# Patient Record
Sex: Male | Born: 1960 | Race: White | Hispanic: No | Marital: Single | Smoking: Never smoker
Health system: Southern US, Community
[De-identification: ages and names within clinical notes are randomized; demographics above are authoritative.]

## PROBLEM LIST (undated history)

## (undated) DIAGNOSIS — I10 Essential (primary) hypertension: Secondary | ICD-10-CM

## (undated) DIAGNOSIS — I509 Heart failure, unspecified: Secondary | ICD-10-CM

## (undated) DIAGNOSIS — E785 Hyperlipidemia, unspecified: Secondary | ICD-10-CM

## (undated) DIAGNOSIS — E663 Overweight: Secondary | ICD-10-CM

## (undated) DIAGNOSIS — I2109 ST elevation (STEMI) myocardial infarction involving other coronary artery of anterior wall: Secondary | ICD-10-CM

## (undated) DIAGNOSIS — Z9861 Coronary angioplasty status: Secondary | ICD-10-CM

## (undated) DIAGNOSIS — E119 Type 2 diabetes mellitus without complications: Secondary | ICD-10-CM

## (undated) DIAGNOSIS — I2510 Atherosclerotic heart disease of native coronary artery without angina pectoris: Secondary | ICD-10-CM

## (undated) HISTORY — DX: Heart failure, unspecified: I50.9

## (undated) HISTORY — DX: Essential (primary) hypertension: I10

## (undated) HISTORY — DX: Hyperlipidemia, unspecified: E78.5

---

## 2009-11-12 ENCOUNTER — Ambulatory Visit: Payer: Self-pay | Admitting: Cardiology

## 2009-11-13 ENCOUNTER — Inpatient Hospital Stay (HOSPITAL_COMMUNITY): Admission: AC | Admit: 2009-11-13 | Discharge: 2009-11-17 | Payer: Self-pay

## 2009-11-13 DIAGNOSIS — I2109 ST elevation (STEMI) myocardial infarction involving other coronary artery of anterior wall: Secondary | ICD-10-CM

## 2009-11-13 HISTORY — DX: ST elevation (STEMI) myocardial infarction involving other coronary artery of anterior wall: I21.09

## 2009-11-16 ENCOUNTER — Encounter: Payer: Self-pay | Admitting: Cardiology

## 2009-12-14 DIAGNOSIS — I4891 Unspecified atrial fibrillation: Secondary | ICD-10-CM

## 2009-12-14 DIAGNOSIS — I5042 Chronic combined systolic (congestive) and diastolic (congestive) heart failure: Secondary | ICD-10-CM | POA: Insufficient documentation

## 2009-12-14 DIAGNOSIS — E785 Hyperlipidemia, unspecified: Secondary | ICD-10-CM | POA: Insufficient documentation

## 2009-12-14 DIAGNOSIS — I509 Heart failure, unspecified: Secondary | ICD-10-CM

## 2009-12-14 DIAGNOSIS — Z9861 Coronary angioplasty status: Secondary | ICD-10-CM

## 2009-12-14 DIAGNOSIS — N186 End stage renal disease: Secondary | ICD-10-CM

## 2009-12-14 DIAGNOSIS — I2510 Atherosclerotic heart disease of native coronary artery without angina pectoris: Secondary | ICD-10-CM

## 2009-12-14 HISTORY — DX: Heart failure, unspecified: I50.9

## 2009-12-16 ENCOUNTER — Ambulatory Visit: Payer: Self-pay | Admitting: Cardiology

## 2010-02-21 ENCOUNTER — Ambulatory Visit: Payer: Self-pay | Admitting: Cardiology

## 2010-02-28 ENCOUNTER — Encounter: Payer: Self-pay | Admitting: Cardiology

## 2010-02-28 LAB — CONVERTED CEMR LAB
ALT: 18 units/L (ref 0–53)
AST: 22 units/L (ref 0–37)
Albumin: 4.1 g/dL (ref 3.5–5.2)
Alkaline Phosphatase: 49 units/L (ref 39–117)
Bilirubin, Direct: 0.2 mg/dL (ref 0.0–0.3)
Cholesterol: 117 mg/dL (ref 0–200)
Total Protein: 7 g/dL (ref 6.0–8.3)
Triglycerides: 113 mg/dL (ref 0.0–149.0)

## 2010-04-04 ENCOUNTER — Telehealth: Payer: Self-pay | Admitting: Cardiology

## 2010-04-05 ENCOUNTER — Ambulatory Visit
Admission: RE | Admit: 2010-04-05 | Discharge: 2010-04-05 | Payer: Self-pay | Source: Home / Self Care | Attending: Cardiology | Admitting: Cardiology

## 2010-04-12 NOTE — Assessment & Plan Note (Signed)
Summary: eph/jml   Visit Type:  Follow-up Primary Provider:  [facility]  CC:  CAD and Cardiomyopathy.  History of Present Illness: The patient presents for followup of non-Q-wave infarction with severe three-vessel coronary disease as described below. He is status post percutaneous revascularization. He also has a cardiomyopathy. Since that time the patient has had no new symptoms or symptoms consistent with his presentation. He has refused cardiac rehabilitation. He denies any chest discomfort, neck or arm discomfort. He has had no palpitations, presyncope or syncope. He has had no PND or orthopnea. He has been trying to dissipate in some activity and is walking. He has been trying to eat better. He says his sugars have been so low that his insulin was stopped by his primary provider.  Current Medications (verified): 1)  Coreg 6.25 Mg Tabs (Carvedilol) .Marland Kitchen.. 1 By Mouth Two Times A Day 2)  Aspirin Ec 325 Mg Tbec (Aspirin) .... Take One Tablet By Mouth Daily 3)  Acetaminophen 325 Mg  Tabs (Acetaminophen) .... As Needed 4)  Enalapril Maleate 2.5 Mg Tabs (Enalapril Maleate) .Marland Kitchen.. 1 By Mouth Two Times A Day 5)  Furosemide 40 Mg Tabs (Furosemide) .Marland Kitchen.. 1 By Mouth Daily 6)  Glimepiride 4 Mg Tabs (Glimepiride) .Marland Kitchen.. 1 By Mouth Daily 7)  Isosorbide Mononitrate Cr 60 Mg Xr24h-Tab (Isosorbide Mononitrate) .Marland Kitchen.. 1 By Mouth Daily 8)  Metformin Hcl 500 Mg Tabs (Metformin Hcl) .Marland Kitchen.. 1 By Mouth Two Times A Day 9)  Nitrostat 0.4 Mg Subl (Nitroglycerin) .Marland Kitchen.. 1 By Mouth As Needed 10)  Effient 10 Mg Tabs (Prasugrel Hcl) .Marland Kitchen.. 1 By Mouth Daily 11)  Simvastatin 40 Mg Tabs (Simvastatin) .Marland Kitchen.. 1 By Mouth Daily 12)  Aldactone 25 Mg Tabs (Spironolactone) .... 1/2  By Mouth Daily  Allergies (verified): No Known Drug Allergies  Past History:  Past Medical History:  1. Non-ST-elevation myocardial infarction.       a.     Cardiac catheterization, November 14, 2009:  Large anterior        wall  myocardial infarction with diffuse triple-vessel coronary        artery disease and severe left ventricular systolic dysfunction.        Left anterior descending with 80% ostial and 70% proximal, total        proximal occlusion of left anterior descending after that, total        occlusion of the ramus branch of the circumflex artery with 80%        stenosis in the distal posterolateral branch, multiple 90%        stenosis lesions in posterior descending branch, multiple 80%        stenosis lesions in posterolateral branches of right coronary        artery with apical wall akinesis and estimated left ventricular        ejection fraction at 30-35%.       b.     At that time of procedure above, successful reperfusion of        percutaneous coronary intervention of the totally occluded        proximal left anterior descending with improvement in proximal and        mid stenosis from 90% to 0% with two overlapping drug-eluting        stents (ION 2.5 x 28 mm and 2.7 ION).       c.     At the time of procedures above, successful percutaneous  coronary intervention of the ostial and very proximal left        anterior descending using one drug-eluting stent (ION 3.0 x 60 mm)        with improvement in center narrowing from 80 and 70% to 20%.   2. Acute systolic heart failure secondary to ischemic cardiomyopathy.   3. Diabetes  4. Hyperlipidemia  Review of Systems       As stated in the HPI and negative for all other systems.   Vital Signs:  Patient profile:   50 year old male Height:      67 inches Weight:      251 pounds BMI:     39.45 Pulse rate:   65 / minute Resp:     16 per minute BP sitting:   138 / 82  (right arm)  Vitals Entered By: Marrion Coy, CNA (December 16, 2009 12:10 PM)  Physical Exam  General:  Well developed, well nourished, in no acute distress. Head:  normocephalic and atraumatic Eyes:  PERRLA/EOM intact; conjunctiva and lids normal. Mouth:  Teeth, gums  and palate normal. Oral mucosa normal. Neck:  Neck supple, no JVD. No masses, thyromegaly or abnormal cervical nodes. Chest Wall:  no deformities or breast masses noted Lungs:  Clear bilaterally to auscultation and percussion. Abdomen:  Bowel sounds positive; abdomen soft and non-tender without masses, organomegaly, or hernias noted. No hepatosplenomegaly. Msk:  Back normal, normal gait. Muscle strength and tone normal. Extremities:  No clubbing or cyanosis. Neurologic:  Alert and oriented x 3. Skin:  Intact without lesions or rashes. Cervical Nodes:  no significant adenopathy Axillary Nodes:  no significant adenopathy Psych:  Normal affect.   Detailed Cardiovascular Exam  Neck    Carotids: Carotids full and equal bilaterally without bruits.      Neck Veins: Normal, no JVD.    Heart    Inspection: no deformities or lifts noted.      Palpation: normal PMI with no thrills palpable.      Auscultation: regular rate and rhythm, S1, S2 without murmurs, rubs, gallops, or clicks.    Vascular    Abdominal Aorta: no palpable masses, pulsations, or audible bruits.      Femoral Pulses: normal femoral pulses bilaterally.      Pedal Pulses: normal pedal pulses bilaterally.      Radial Pulses: normal radial pulses bilaterally.      Peripheral Circulation: no clubbing, cyanosis, or edema noted with normal capillary refill.     EKG  Procedure date:  12/16/2009  Findings:      Sinus rhythm, rate 65, leftward axis, recent anteroseptal infarct with anterolateral T wave inversions  Impression & Recommendations:  Problem # 1:  CAD (ICD-414.00) The patient has severe CAD status post multivessel revascularization. I discussed the importance of risk reduction in great detail. Hopefully he can comply. He still declines cardiac rehabilitation.  Problem # 2:  CHF (ICD-428.0) Today I will increase his carvedilol to 12.5 mg b.i.d.  Problem # 3:  HYPERLIPIDEMIA (ICD-272.4) When he returns for med  titration I will check a lipid profile with a goal LDL less than 70 and HDL greater than 40.  Other Orders: EKG w/ Interpretation (93000)  Patient Instructions: 1)  Your physician recommends that you schedule a follow-up appointment in: 2 months with Dr Antoine Poche 2)  Your physician recommends that you return for a FASTING lipid and liver profile: 272.0 v58.69 3)  Your physician has recommended you make the following change  in your medication: increase carvedilol to 12.5 mg one twice a day Prescriptions: CARVEDILOL 12.5 MG TABS (CARVEDILOL) one twice a day  #60 x 6   Entered by:   Charolotte Capuchin, RN   Authorized by:   Rollene Rotunda, MD, [facility]   Signed by:   Charolotte Capuchin, RN on 12/16/2009   Method used:   Electronically to        [facility]  [address] (retail)       [address]       [place], [place]  [address]       Ph: [phone]       Fax: [phone]   RxID:   [id number]  I have reviewed and approved all prescriptions at the time of this visit. Rollene Rotunda, MD, [facility]  December 16, 2009 1:27 PM

## 2010-04-14 NOTE — Progress Notes (Signed)
Summary: pt needs samples  Phone Note Refill Request Call back at Work Phone [phone] Message from:  Patient  Refills Requested: Medication #1:  EFFIENT 10 MG TABS 1 by mouth daily pt needs samples  Initial call taken by: Omer Jack,  April 04, 2010 4:38 PM  Follow-up for Phone Call        pt has appt with [facility] today. No samples available at this time. Will offer pt discount copay card. Marrion Coy, CNA  April 05, 2010 8:43 AM  Follow-up by: Marrion Coy, CNA,  April 05, 2010 8:43 AM

## 2010-04-14 NOTE — Letter (Signed)
Summary: Custom - Lipid  [facility], Main Office  [address]   [place], [place] [address]   Phone: [phone]  Fax: [phone]         February 28, 2010 MRN: [id number]   Justin [name] [address] [place], [place]  [address]   Dear Mr. Justin [name],  We have reviewed your cholesterol results.  They are as follows:     Total Cholesterol:    117 (Desirable: less than 200)       HDL  Cholesterol:     27.40  (Desirable: greater than 40 for men and 50 for women)       LDL Cholesterol:       67  (Desirable: less than 100 for low risk and less than 70 for moderate to high risk)       Triglycerides:       113.0  (Desirable: less than 150)  Our recommendations include:  Please increase walking as much as possible to help increase HDL.    Call our office at the number listed above if you have any questions.  Lowering your LDL cholesterol is important, but it is only one of a large number of "risk factors" that may indicate that you are at risk for heart disease, stroke or other complications of hardening of the arteries.  Other risk factors include:   A.  Cigarette Smoking* B.  High Blood Pressure* C.  Obesity* D.   Low HDL Cholesterol (see yours above)* E.   Diabetes Mellitus (higher risk if your is uncontrolled) F.  Family history of premature heart disease G.  Previous history of stroke or cardiovascular disease    *These are risk factors YOU HAVE CONTROL OVER.  For more information, visit .        There is now evidence that lowering the TOTAL CHOLESTEROL AND LDL CHOLESTEROL can reduce the risk of heart disease.  The American Heart Association recommends the following guidelines for the treatment of elevated cholesterol:  1.  If there is now current heart disease and less than two risk factors, TOTAL CHOLESTEROL should be less than 200 and LDL CHOLESTEROL should be less than 100. 2.  If there is current heart disease or two or more risk factors,  TOTAL CHOLESTEROL should be less than 200 and LDL CHOLESTEROL should be less than 70.  A diet low in cholesterol, saturated fat, and calories is the cornerstone of treatment for elevated cholesterol.  Cessation of smoking and exercise are also important in the management of elevated cholesterol and preventing vascular disease.  Studies have shown that 30 to 60 minutes of physical activity most days can help lower blood pressure, lower cholesterol, and keep your weight at a healthy level.  Drug therapy is used when cholesterol levels do not respond to therapeutic lifestyle changes (smoking cessation, diet, and exercise) and remains unacceptably high.  If medication is started, it is important to have you levels checked periodically to evaluate the need for further treatment options.    Thank you,     Sander Nephew, RN for Dr Rollene Rotunda [facility] Team

## 2010-04-14 NOTE — Assessment & Plan Note (Signed)
Summary: per check out/sf   Visit Type:  Follow-up Primary Provider:  Dr. Creta Levin [facility])  CC:  Cardiomyopathy.  History of Present Illness: The patient presents for followup of his cardiomyopathy. At the last appointment I increased enalapril. He tolerated this well without lightheadedness, presyncope or syncope. He has had no chest pressure, neck or arm discomfort. He has had no palpitations. He denies any shortness of breath, PND or orthopnea. He has had no weight gain or edema.   Current Medications (verified): 1)  Carvedilol 12.5 Mg Tabs (Carvedilol) .... One Twice A Day 2)  Aspirin Ec 325 Mg Tbec (Aspirin) .... Take One Tablet By Mouth Daily 3)  Acetaminophen 325 Mg  Tabs (Acetaminophen) .... As Needed 4)  Enalapril Maleate 5 Mg Tabs (Enalapril Maleate) .Marland Kitchen.. 1 Every Morning and 2 Every Evening 5)  Furosemide 40 Mg Tabs (Furosemide) .Marland Kitchen.. 1 By Mouth Daily 6)  Glimepiride 4 Mg Tabs (Glimepiride) .Marland Kitchen.. 1 By Mouth Daily 7)  Isosorbide Mononitrate Cr 60 Mg Xr24h-Tab (Isosorbide Mononitrate) .Marland Kitchen.. 1 By Mouth Daily 8)  Metformin Hcl 500 Mg Tabs (Metformin Hcl) .Marland Kitchen.. 1 By Mouth Two Times A Day 9)  Nitrostat 0.4 Mg Subl (Nitroglycerin) .Marland Kitchen.. 1 By Mouth As Needed 10)  Effient 10 Mg Tabs (Prasugrel Hcl) .Marland Kitchen.. 1 By Mouth Daily 11)  Simvastatin 40 Mg Tabs (Simvastatin) .Marland Kitchen.. 1 By Mouth Daily 12)  Aldactone 25 Mg Tabs (Spironolactone) .... 1/2  By Mouth Daily  Allergies (verified): No Known Drug Allergies  Past History:  Past Medical History: Reviewed history from 02/21/2010 and no changes required. 1. Non-ST-elevation myocardial infarction.     a.     Cardiac catheterization, November 14, 2009:  Large anterior      wall myocardial infarction with diffuse triple-vessel coronary      artery disease and severe left ventricular systolic dysfunction.      Left anterior descending with 80% ostial and 70% proximal, total      proximal occlusion of left anterior descending  after that, total      occlusion of the ramus branch of the circumflex artery with 80%      stenosis in the distal posterolateral branch, multiple 90%      stenosis lesions in posterior descending branch, multiple 80%      stenosis lesions in posterolateral branches of right coronary      artery with apical wall akinesis and estimated left ventricular      ejection fraction at 30-35%.     b.     At that time of procedure above, successful reperfusion of      percutaneous coronary intervention of the totally occluded      proximal left anterior descending with improvement in proximal and      mid stenosis from 90% to 0% with two overlapping drug-eluting      stents (ION 2.5 x 28 mm and 2.7 ION).     c.     At the time of procedures above, successful percutaneous      coronary intervention of the ostial and very proximal left      anterior descending using one drug-eluting stent (ION 3.0 x 60 mm)      with improvement in center narrowing from 80 and 70% to 20%.     d.     Discharged on dual antiplatelet therapy with full-strength      aspirin and Effient 10 mg p.o. daily (borderline response to  Plavix with PRU equal to 2-6), beta blockade with Coreg 6.25 mg      p.o. b.i.d., angiotensin-converting enzyme inhibitor therapy with      enalapril 2.5 mg p.o. b.i.d., Imdur 60 mg p.o. daily, and p.r.n.      sublingual nitroglycerin, as well as statin therapy with      simvastatin 40 mg p.o. daily. 2. Acute systolic heart failure secondary to ischemic cardiomyopathy.     a.     Please see cath results described in discharge diagnosis #1.     b.     A 2-D echocardiogram     Review of Systems       As stated in the HPI and negative for all other systems.   Vital Signs:  Patient profile:   50 year old male Height:      67 inches Weight:      238 pounds BMI:     37.41 Pulse rate:   72 / minute Resp:     16 per minute BP sitting:   134 / 82  (right arm)  Vitals Entered By: Marrion Coy, CNA (April 05, 2010 9:15 AM)  Physical Exam  General:  Well developed, well nourished, in no acute distress. Head:  normocephalic and atraumatic Eyes:  PERRLA/EOM intact; conjunctiva and lids normal. Mouth:  Teeth, gums and palate normal. Oral mucosa normal. Neck:  Neck supple, no JVD. No masses, thyromegaly or abnormal cervical nodes. Chest Wall:  no deformities or breast masses noted Lungs:  Clear bilaterally to auscultation and percussion. Abdomen:  Bowel sounds positive; abdomen soft and non-tender without masses, organomegaly, or hernias noted. No hepatosplenomegaly. Msk:  Back normal, normal gait. Muscle strength and tone normal. Extremities:  No clubbing or cyanosis. Neurologic:  Alert and oriented x 3. Skin:  Intact without lesions or rashes. Cervical Nodes:  no significant adenopathy Psych:  Normal affect.   Detailed Cardiovascular Exam  Neck    Carotids: Carotids full and equal bilaterally without bruits.      Neck Veins: Normal, no JVD.    Heart    Inspection: no deformities or lifts noted.      Palpation: normal PMI with no thrills palpable.      Auscultation: regular rate and rhythm, S1, S2 without murmurs, rubs, gallops, or clicks.    Vascular    Abdominal Aorta: no palpable masses, pulsations, or audible bruits.      Femoral Pulses: normal femoral pulses bilaterally.      Pedal Pulses: normal pedal pulses bilaterally.      Radial Pulses: normal radial pulses bilaterally.      Peripheral Circulation: no clubbing, cyanosis, or edema noted with normal capillary refill.     Impression & Recommendations:  Problem # 1:  CHF (ICD-428.0) Today I will increase his ienalapril by adding another 5 mg p.m. dose.  Problem # 2:  HYPERLIPIDEMIA (ICD-272.4) He remains on meds as listed. His LDL is at target to his age he is low. He has been walking more and hopefully this will bring the HDL up  Problem # 3:  CAD (ICD-414.00) He continues with risk  reduction.  Patient Instructions: 1)  Your physician recommends that you schedule a follow-up appointment in: 6 weeks  2)  Your physician has recommended you make the following change in your medication: Increase Enalapril 5mg  every morning and 10mg  every evening. Prescriptions: ENALAPRIL MALEATE 5 MG TABS (ENALAPRIL MALEATE) 1 every morning and 2 every evening  #90 x 11  Entered by:   Marrion Coy, CNA   Authorized by:   Rollene Rotunda, MD, [facility]   Signed by:   Marrion Coy, CNA on 04/05/2010   Method used:   Electronically to        [facility] (retail)       [address]       [place], [place]  [address]       Ph: [phone]       Fax: [phone]   RxID:   [id number]  I have reviewed and approved all prescriptions at the time of this visit.

## 2010-04-14 NOTE — Assessment & Plan Note (Signed)
Summary: f40m per check out/pt to have lab work today/lg   Visit Type:  Follow-up Primary Provider:  [facility]   History of Present Illness: The patient presents for followup of his known coronary disease and cardiomyopathy. Since I last saw him he has had no new cardiovascular complaints. I increased his rate to 12.5 mg b.i.d. He has done well with this. He has changed his diet and is walking or exercising daily. He has lost 8 pounds. He is having the chest pressure, neck or arm discomfort. He is having no shortness of breath, PND or orthopnea. He is having no palpitations, presyncope or syncope.   Current Medications (verified): 1)  Carvedilol 12.5 Mg Tabs (Carvedilol) .... One Twice A Day 2)  Aspirin Ec 325 Mg Tbec (Aspirin) .... Take One Tablet By Mouth Daily 3)  Acetaminophen 325 Mg  Tabs (Acetaminophen) .... As Needed 4)  Enalapril Maleate 2.5 Mg Tabs (Enalapril Maleate) .Marland Kitchen.. 1 By Mouth Two Times A Day 5)  Furosemide 40 Mg Tabs (Furosemide) .Marland Kitchen.. 1 By Mouth Daily 6)  Glimepiride 4 Mg Tabs (Glimepiride) .Marland Kitchen.. 1 By Mouth Daily 7)  Isosorbide Mononitrate Cr 60 Mg Xr24h-Tab (Isosorbide Mononitrate) .Marland Kitchen.. 1 By Mouth Daily 8)  Metformin Hcl 500 Mg Tabs (Metformin Hcl) .Marland Kitchen.. 1 By Mouth Two Times A Day 9)  Nitrostat 0.4 Mg Subl (Nitroglycerin) .Marland Kitchen.. 1 By Mouth As Needed 10)  Effient 10 Mg Tabs (Prasugrel Hcl) .Marland Kitchen.. 1 By Mouth Daily 11)  Simvastatin 40 Mg Tabs (Simvastatin) .Marland Kitchen.. 1 By Mouth Daily 12)  Aldactone 25 Mg Tabs (Spironolactone) .... 1/2  By Mouth Daily  Allergies (verified): No Known Drug Allergies  Past History:  Past Medical History: 1. Non-ST-elevation myocardial infarction.     a.     Cardiac catheterization, November 14, 2009:  Large anterior      wall myocardial infarction with diffuse triple-vessel coronary      artery disease and severe left ventricular systolic dysfunction.      Left anterior descending with 80% ostial and 70% proximal, total  proximal occlusion of left anterior descending after that, total      occlusion of the ramus branch of the circumflex artery with 80%      stenosis in the distal posterolateral branch, multiple 90%      stenosis lesions in posterior descending branch, multiple 80%      stenosis lesions in posterolateral branches of right coronary      artery with apical wall akinesis and estimated left ventricular      ejection fraction at 30-35%.     b.     At that time of procedure above, successful reperfusion of      percutaneous coronary intervention of the totally occluded      proximal left anterior descending with improvement in proximal and      mid stenosis from 90% to 0% with two overlapping drug-eluting      stents (ION 2.5 x 28 mm and 2.7 ION).     c.     At the time of procedures above, successful percutaneous      coronary intervention of the ostial and very proximal left      anterior descending using one drug-eluting stent (ION 3.0 x 60 mm)      with improvement in center narrowing from 80 and 70% to 20%.     d.     Discharged on dual antiplatelet therapy with full-strength      aspirin and  Effient 10 mg p.o. daily (borderline response to      Plavix with PRU equal to 2-6), beta blockade with Coreg 6.25 mg      p.o. b.i.d., angiotensin-converting enzyme inhibitor therapy with      enalapril 2.5 mg p.o. b.i.d., Imdur 60 mg p.o. daily, and p.r.n.      sublingual nitroglycerin, as well as statin therapy with      simvastatin 40 mg p.o. daily. 2. Acute systolic heart failure secondary to ischemic cardiomyopathy.     a.     Please see cath results described in discharge diagnosis #1.     b.     A 2-D echocardiogram     Past Surgical History: None.   Family History:  Is very can positive for early coronary artery disease   with his father having MI in his 84s.  Mother with cardiomyopathy.  A   sister with bypass in her 63s and another sister with heart disease   starting in her 30s.           Social History:   The patient lives with his girlfriend.  He has never   smoked cigarettes.  Does not drink alcohol.      Review of Systems       As stated in the HPI and negative for all other systems.   Vital Signs:  Patient profile:   50 year old male Height:      67 inches Weight:      243 pounds BMI:     38.20 Pulse rate:   64 / minute Resp:     16 per minute BP sitting:   148 / 89  (right arm)  Vitals Entered By: Marrion Coy, CNA (February 21, 2010 8:48 AM)  Physical Exam  General:  Well developed, well nourished, in no acute distress. Head:  normocephalic and atraumatic Eyes:  PERRLA/EOM intact; conjunctiva and lids normal. Mouth:  Teeth, gums and palate normal. Oral mucosa normal. Neck:  Neck supple, no JVD. No masses, thyromegaly or abnormal cervical nodes. Chest Wall:  no deformities or breast masses noted Lungs:  Clear bilaterally to auscultation and percussion. Abdomen:  Bowel sounds positive; abdomen soft and non-tender without masses, organomegaly, or hernias noted. No hepatosplenomegaly. Msk:  Back normal, normal gait. Muscle strength and tone normal. Extremities:  No clubbing or cyanosis. Neurologic:  Alert and oriented x 3. Skin:  Intact without lesions or rashes. Cervical Nodes:  no significant adenopathy Axillary Nodes:  no significant adenopathy Psych:  Normal affect.   Detailed Cardiovascular Exam  Neck    Carotids: Carotids full and equal bilaterally without bruits.      Neck Veins: Normal, no JVD.    Heart    Inspection: no deformities or lifts noted.      Palpation: normal PMI with no thrills palpable.      Auscultation: regular rate and rhythm, S1, S2 without murmurs, rubs, gallops, or clicks.    Vascular    Abdominal Aorta: no palpable masses, pulsations, or audible bruits.      Femoral Pulses: normal femoral pulses bilaterally.      Pedal Pulses: normal pedal pulses bilaterally.      Radial Pulses: normal radial pulses  bilaterally.      Peripheral Circulation: no clubbing, cyanosis, or edema noted with normal capillary refill.     Impression & Recommendations:  Problem # 1:  CAD (ICD-414.00) The patient has had no new cardiovascular complaints. He has done nicely and  risk reduction.  I will titrate meds as below.  Problem # 2:  CHF (ICD-428.0) I will increase his enalapril to 5 mg by mouth two times a day.    Problem # 3:  HYPERLIPIDEMIA (ICD-272.4) He will have a lipid profile today.  I will treat to an LDL less than 70 and HDL greater than 40.  Patient Instructions: 1)  Your physician recommends that you schedule a follow-up appointment in: 1 month 2)  Your physician has recommended you make the following change in your medication: INCREASE your ENALAPRIL to 5mg  by mouth two times a day.  Prescriptions: ENALAPRIL MALEATE 5 MG TABS (ENALAPRIL MALEATE) Take one tablet by mouth twice a day  #60 x 3   Entered by:   Whitney Maeola Sarah RN   Authorized by:   Rollene Rotunda, MD, [facility]   Signed by:   Ellender Hose RN on 02/21/2010   Method used:   Electronically to        [facility] (retail)       [address]       [place], [place]  [address]       Ph: [phone]       Fax: [phone]   RxID:   [id number]  I have reviewed and approved all prescriptions at the time of this visit. Rollene Rotunda, MD, [facility]  February 21, 2010 1:33 PM

## 2010-05-13 ENCOUNTER — Encounter (INDEPENDENT_AMBULATORY_CARE_PROVIDER_SITE_OTHER): Payer: Self-pay | Admitting: *Deleted

## 2010-05-16 ENCOUNTER — Ambulatory Visit: Payer: Self-pay | Admitting: Cardiology

## 2010-05-19 NOTE — Letter (Signed)
Summary: Appointment - Reschedule  [facility], Main Office  [address]   [place], [place] [address]   Phone: [phone]  Fax: [phone]     May 13, 2010 MRN: [id number]   [name] [address] [place], [place]  [address]   Dear Mr. BRAKEBILL,   Due to a change in our office schedule, your appointment on 06-13-2010                      at   10:15 a.m.              must be changed.  It is very important that we reach you to reschedule this appointment. We look forward to participating in your health care needs. Please contact us at the number listed above at your earliest convenience to reschedule this appointment.     Sincerely,       Lorne Skeens  [facility] Scheduling Team

## 2010-05-26 LAB — CBC
HCT: 40.6 % (ref 39.0–52.0)
HCT: 42.9 % (ref 39.0–52.0)
Hemoglobin: 13.7 g/dL (ref 13.0–17.0)
Hemoglobin: 14.7 g/dL (ref 13.0–17.0)
Hemoglobin: 14.8 g/dL (ref 13.0–17.0)
Hemoglobin: 16.2 g/dL (ref 13.0–17.0)
Hemoglobin: 16.8 g/dL (ref 13.0–17.0)
MCH: 30.8 pg (ref 26.0–34.0)
MCH: 31.3 pg (ref 26.0–34.0)
MCH: 31.6 pg (ref 26.0–34.0)
MCH: 31.7 pg (ref 26.0–34.0)
MCHC: 33.7 g/dL (ref 30.0–36.0)
MCHC: 34 g/dL (ref 30.0–36.0)
MCHC: 34.2 g/dL (ref 30.0–36.0)
MCHC: 35.1 g/dL (ref 30.0–36.0)
MCHC: 35.2 g/dL (ref 30.0–36.0)
MCV: 90.5 fL (ref 78.0–100.0)
MCV: 91.6 fL (ref 78.0–100.0)
Platelets: 259 10*3/uL (ref 150–400)
Platelets: 271 10*3/uL (ref 150–400)
Platelets: 297 10*3/uL (ref 150–400)
RBC: 4.74 MIL/uL (ref 4.22–5.81)
RBC: 4.74 MIL/uL (ref 4.22–5.81)
RDW: 12.3 % (ref 11.5–15.5)
RDW: 12.4 % (ref 11.5–15.5)
RDW: 12.6 % (ref 11.5–15.5)
RDW: 12.6 % (ref 11.5–15.5)
WBC: 10.7 10*3/uL — ABNORMAL HIGH (ref 4.0–10.5)

## 2010-05-26 LAB — BASIC METABOLIC PANEL
BUN: 16 mg/dL (ref 6–23)
BUN: 20 mg/dL (ref 6–23)
BUN: 20 mg/dL (ref 6–23)
BUN: 22 mg/dL (ref 6–23)
CO2: 24 mEq/L (ref 19–32)
CO2: 27 mEq/L (ref 19–32)
CO2: 28 mEq/L (ref 19–32)
CO2: 29 mEq/L (ref 19–32)
CO2: 30 mEq/L (ref 19–32)
Calcium: 9.2 mg/dL (ref 8.4–10.5)
Calcium: 9.4 mg/dL (ref 8.4–10.5)
Chloride: 100 meq/L (ref 96–112)
Chloride: 96 mEq/L (ref 96–112)
Chloride: 99 mEq/L (ref 96–112)
Chloride: 99 mEq/L (ref 96–112)
Creatinine, Ser: 0.91 mg/dL (ref 0.4–1.5)
Creatinine, Ser: 0.95 mg/dL (ref 0.4–1.5)
Creatinine, Ser: 1.05 mg/dL (ref 0.4–1.5)
GFR calc Af Amer: >60 mL/min (ref >60)
GFR calc Af Amer: >60 mL/min (ref >60)
GFR calc Af Amer: >60 mL/min (ref >60)
GFR calc Af Amer: >60 mL/min (ref >60)
GFR calc Af Amer: >60 mL/min (ref >60)
GFR calc non Af Amer: >60 mL/min (ref >60)
GFR calc non Af Amer: >60 mL/min (ref >60)
GFR calc non Af Amer: >60 mL/min (ref >60)
GFR calc non Af Amer: >60 mL/min (ref >60)
GFR calc non Af Amer: >60 mL/min (ref >60)
Glucose, Bld: 182 mg/dL — ABNORMAL HIGH (ref 70–99)
Glucose, Bld: 186 mg/dL — ABNORMAL HIGH (ref 70–99)
Glucose, Bld: 208 mg/dL — ABNORMAL HIGH (ref 70–99)
Glucose, Bld: 250 mg/dL — ABNORMAL HIGH (ref 70–99)
Glucose, Bld: 413 mg/dL — ABNORMAL HIGH (ref 70–99)
Potassium: 3.2 mEq/L — ABNORMAL LOW (ref 3.5–5.1)
Potassium: 3.4 mEq/L — ABNORMAL LOW (ref 3.5–5.1)
Potassium: 3.6 mEq/L (ref 3.5–5.1)
Potassium: 3.8 mEq/L (ref 3.5–5.1)
Potassium: 4.1 meq/L (ref 3.5–5.1)
Potassium: 4.2 mEq/L (ref 3.5–5.1)
Sodium: 133 mEq/L — ABNORMAL LOW (ref 135–145)
Sodium: 133 mEq/L — ABNORMAL LOW (ref 135–145)
Sodium: 138 mEq/L (ref 135–145)
Sodium: 138 mEq/L (ref 135–145)
Sodium: 141 mEq/L (ref 135–145)

## 2010-05-26 LAB — LIPID PANEL
Cholesterol: 225 mg/dL — ABNORMAL HIGH (ref 0–200)
LDL Cholesterol: 155 mg/dL — ABNORMAL HIGH (ref 0–99)
VLDL: 42 mg/dL — ABNORMAL HIGH (ref 0–40)

## 2010-05-26 LAB — POCT I-STAT 3, VENOUS BLOOD GAS (G3P V)
Acid-Base Excess: 2 mmol/L (ref 0.0–2.0)
Bicarbonate: 27.7 meq/L — ABNORMAL HIGH (ref 20.0–24.0)
O2 Saturation: 69 %
TCO2: 29 mmol/L (ref 0–100)
pH, Ven: 7.384 — ABNORMAL HIGH (ref 7.250–7.300)

## 2010-05-26 LAB — GLUCOSE, CAPILLARY
Glucose-Capillary: 108 mg/dL — ABNORMAL HIGH (ref 70–99)
Glucose-Capillary: 154 mg/dL — ABNORMAL HIGH (ref 70–99)
Glucose-Capillary: 166 mg/dL — ABNORMAL HIGH (ref 70–99)
Glucose-Capillary: 187 mg/dL — ABNORMAL HIGH (ref 70–99)
Glucose-Capillary: 194 mg/dL — ABNORMAL HIGH (ref 70–99)
Glucose-Capillary: 206 mg/dL — ABNORMAL HIGH (ref 70–99)
Glucose-Capillary: 217 mg/dL — ABNORMAL HIGH (ref 70–99)
Glucose-Capillary: 243 mg/dL — ABNORMAL HIGH (ref 70–99)
Glucose-Capillary: 304 mg/dL — ABNORMAL HIGH (ref 70–99)
Glucose-Capillary: 317 mg/dL — ABNORMAL HIGH (ref 70–99)
Glucose-Capillary: 357 mg/dL — ABNORMAL HIGH (ref 70–99)
Glucose-Capillary: 427 mg/dL — ABNORMAL HIGH (ref 70–99)
Glucose-Capillary: 489 mg/dL — ABNORMAL HIGH (ref 70–99)
Glucose-Capillary: 521 mg/dL — ABNORMAL HIGH (ref 70–99)
Glucose-Capillary: 77 mg/dL (ref 70–99)

## 2010-05-26 LAB — HEPATIC FUNCTION PANEL
ALT: 24 U/L (ref 0–53)
AST: 26 U/L (ref 0–37)
Albumin: 3.6 g/dL (ref 3.5–5.2)
Alkaline Phosphatase: 90 U/L (ref 39–117)
Bilirubin, Direct: 0.3 mg/dL (ref 0.0–0.3)
Indirect Bilirubin: 0.7 mg/dL (ref 0.3–0.9)
Total Bilirubin: 1 mg/dL (ref 0.3–1.2)
Total Protein: 7.6 g/dL (ref 6.0–8.3)

## 2010-05-26 LAB — DIFFERENTIAL
Basophils Absolute: 0 10*3/uL (ref 0.0–0.1)
Basophils Absolute: 0.1 10*3/uL (ref 0.0–0.1)
Basophils Relative: 0 % (ref 0–1)
Basophils Relative: 0 % (ref 0–1)
Eosinophils Absolute: 0.1 10*3/uL (ref 0.0–0.7)
Eosinophils Relative: 1 % (ref 0–5)
Eosinophils Relative: 1 % (ref 0–5)
Lymphocytes Relative: 16 % (ref 12–46)
Lymphs Abs: 2.7 10*3/uL (ref 0.7–4.0)
Monocytes Absolute: 0.7 10*3/uL (ref 0.1–1.0)
Monocytes Absolute: 0.9 10*3/uL (ref 0.1–1.0)
Monocytes Relative: 4 % (ref 3–12)
Monocytes Relative: 8 % (ref 3–12)
Neutro Abs: 13.5 10*3/uL — ABNORMAL HIGH (ref 1.7–7.7)
Neutrophils Relative %: 79 % — ABNORMAL HIGH (ref 43–77)

## 2010-05-26 LAB — POCT CARDIAC MARKERS
CKMB, poc: <1 ng/mL — ABNORMAL LOW (ref 1.0–8.0)
Myoglobin, poc: 96.2 ng/mL (ref 12–200)

## 2010-05-26 LAB — URINALYSIS, ROUTINE W REFLEX MICROSCOPIC
Nitrite: NEGATIVE
Specific Gravity, Urine: 1.027 (ref 1.005–1.030)
Urobilinogen, UA: 0.2 mg/dL (ref 0.0–1.0)
pH: 5 (ref 5.0–8.0)

## 2010-05-26 LAB — BRAIN NATRIURETIC PEPTIDE: Pro B Natriuretic peptide (BNP): 192 pg/mL — ABNORMAL HIGH (ref 0.0–100.0)

## 2010-05-26 LAB — CARDIAC PANEL(CRET KIN+CKTOT+MB+TROPI)
CK, MB: 13.2 ng/mL (ref 0.3–4.0)
CK, MB: 20.2 ng/mL (ref 0.3–4.0)
Relative Index: 4.7 — ABNORMAL HIGH (ref 0.0–2.5)
Relative Index: 6.1 — ABNORMAL HIGH (ref 0.0–2.5)
Total CK: 333 U/L — ABNORMAL HIGH (ref 7–232)
Troponin I: 10.08 ng/mL (ref 0.00–0.06)

## 2010-05-26 LAB — CK TOTAL AND CKMB (NOT AT ARMC): Total CK: 135 U/L (ref 7–232)

## 2010-05-26 LAB — PLATELET INHIBITION P2Y12
P2Y12 % Inhibition: 17 %
Platelet Function  P2Y12: 226 [PRU] (ref 194–418)

## 2010-05-26 LAB — TSH: TSH: 1.122 u[IU]/mL (ref 0.350–4.500)

## 2010-05-26 LAB — URINE MICROSCOPIC-ADD ON

## 2010-05-26 LAB — APTT: aPTT: 28 seconds (ref 24–37)

## 2010-05-26 LAB — TROPONIN I: Troponin I: 1.78 ng/mL (ref 0.00–0.06)

## 2010-05-26 LAB — POCT I-STAT 3, ART BLOOD GAS (G3+)
Bicarbonate: 27.1 meq/L — ABNORMAL HIGH (ref 20.0–24.0)
pCO2 arterial: 42.1 mmHg (ref 35.0–45.0)

## 2010-05-26 LAB — HEPARIN LEVEL (UNFRACTIONATED): Heparin Unfractionated: <0.1 IU/mL — ABNORMAL LOW (ref 0.30–0.70)

## 2010-05-26 LAB — MRSA PCR SCREENING: MRSA by PCR: NEGATIVE

## 2010-06-09 ENCOUNTER — Other Ambulatory Visit: Payer: Self-pay | Admitting: Cardiovascular Disease

## 2010-06-11 ENCOUNTER — Other Ambulatory Visit: Payer: Self-pay | Admitting: *Deleted

## 2010-06-11 MED ORDER — ISOSORBIDE MONONITRATE ER 60 MG PO TB24: 60.0000 mg | ORAL_TABLET | Freq: Every day | ORAL | Status: DC

## 2010-06-11 MED ORDER — FUROSEMIDE 40 MG PO TABS: 40.0000 mg | ORAL_TABLET | Freq: Every day | ORAL | Status: DC

## 2010-06-13 ENCOUNTER — Ambulatory Visit: Payer: Self-pay | Admitting: Cardiology

## 2010-06-22 ENCOUNTER — Encounter: Payer: Self-pay | Admitting: Cardiology

## 2010-06-26 ENCOUNTER — Encounter: Payer: Self-pay | Admitting: Cardiology

## 2010-06-27 ENCOUNTER — Ambulatory Visit (INDEPENDENT_AMBULATORY_CARE_PROVIDER_SITE_OTHER): Payer: Self-pay | Admitting: Cardiology

## 2010-06-27 ENCOUNTER — Encounter: Payer: Self-pay | Admitting: Cardiology

## 2010-06-27 VITALS — BP 131/87 | HR 61 | Ht 67.0 in | Wt 235.8 lb

## 2010-06-27 DIAGNOSIS — I2510 Atherosclerotic heart disease of native coronary artery without angina pectoris: Secondary | ICD-10-CM

## 2010-06-27 DIAGNOSIS — E785 Hyperlipidemia, unspecified: Secondary | ICD-10-CM

## 2010-06-27 DIAGNOSIS — I509 Heart failure, unspecified: Secondary | ICD-10-CM

## 2010-06-27 DIAGNOSIS — N186 End stage renal disease: Secondary | ICD-10-CM

## 2010-06-27 DIAGNOSIS — I4891 Unspecified atrial fibrillation: Secondary | ICD-10-CM

## 2010-06-27 MED ORDER — ENALAPRIL MALEATE 10 MG PO TABS: 10.0000 mg | ORAL_TABLET | Freq: Two times a day (BID) | ORAL | Status: AC

## 2010-06-27 NOTE — Assessment & Plan Note (Signed)
Today I will increase his enalapril to 10 mg b.i.d. To further manage his ischemic cardiomyopathy. In the future if he is yet has not improved he would be an ICD candidate.

## 2010-06-27 NOTE — Assessment & Plan Note (Addendum)
He has no heart failure symptoms.  He will continue with the meds as listed.

## 2010-06-27 NOTE — Progress Notes (Signed)
HPI The patient presents for followup of coronary disease and cardiomyopathy. Since I last saw him he has had no new cardiovascular problems. He denies any chest pressure, neck or arm discomfort. He has had no palpitations, presyncope or syncope. He has had no weight gain or edema. He has been compliant with his medications. He is walking for exercise and also at work and has no significant limitations. He reports that he has been following a better diet.  No Known Allergies  Current Outpatient Prescriptions  Medication Sig Dispense Refill  . acetaminophen (TYLENOL) 325 MG tablet Take 650 mg by mouth every 6 (six) hours as needed.        Marland Kitchen aspirin 325 MG tablet Take 325 mg by mouth daily.        . carvedilol (COREG) 12.5 MG tablet Take 12.5 mg by mouth 2 (two) times daily with a meal.        . enalapril (VASOTEC) 5 MG tablet 1 tab in the AM and 2 tabs in the PM       . furosemide (LASIX) 40 MG tablet TAKE ONE TABLET BY MOUTH EVERY DAY  30 tablet  11  . glimepiride (AMARYL) 4 MG tablet Take 4 mg by mouth daily before breakfast.        . isosorbide mononitrate (IMDUR) 60 MG 24 hr tablet TAKE ONE TABLET BY MOUTH EVERY DAY  30 tablet  11  . metFORMIN (GLUCOPHAGE) 500 MG tablet Take 500 mg by mouth 2 (two) times daily with a meal.        . nitroGLYCERIN (NITROSTAT) 0.4 MG SL tablet Place 0.4 mg under the tongue every 5 (five) minutes as needed.        . prasugrel (EFFIENT) 10 MG TABS Take 10 mg by mouth daily.        . simvastatin (ZOCOR) 40 MG tablet Take 40 mg by mouth at bedtime.        Marland Kitchen spironolactone (ALDACTONE) 25 MG tablet Take 12.5 mg by mouth daily.        Marland Kitchen DISCONTD: furosemide (LASIX) 40 MG tablet Take 1 tablet (40 mg total) by mouth daily.  30 tablet  11  . DISCONTD: isosorbide mononitrate (IMDUR) 60 MG 24 hr tablet Take 1 tablet (60 mg total) by mouth daily.  30 tablet  11    Past Medical History  Diagnosis Date  . CHF 12/14/2009    EF 35%  . Non-ST elevated myocardial infarction  (non-STEMI)     November 15, 1999 anterior MI LAD 80% ostial, 70% proximal followed by total occlusion, ramus intermediate total occlusion circumflex 80%, PDA 90% lesions. Overlapping drug-eluting stents to the LAD    No past surgical history on file.  ROS As stated in the HPI and negative for all other systems.  PHYSICAL EXAM BP 131/87  Pulse 61  Ht 5\' 7"  (1.702 m)  Wt 235 lb 12.8 oz (106.958 kg)  BMI 36.93 kg/m2 GENERAL:  Well appearing HEENT:  Pupils equal round and reactive, fundi not visualized, oral mucosa unremarkable NECK:  No jugular venous distention, waveform within normal limits, carotid upstroke brisk and symmetric, no bruits, no thyromegaly LYMPHATICS:  No cervical, inguinal adenopathy LUNGS:  Clear to auscultation bilaterally BACK:  No CVA tenderness CHEST:  Unremarkable HEART:  PMI not displaced or sustained,S1 and S2 within normal limits, no S3, no S4, no clicks, no rubs, no murmurs ABD:  Flat, positive bowel sounds normal in frequency in pitch, no bruits, no rebound,  no guarding, no midline pulsatile mass, no hepatomegaly, no splenomegaly EXT:  2 plus pulses throughout, no edema, no cyanosis no clubbing SKIN:  No rashes no nodules NEURO:  Cranial nerves II through XII grossly intact, motor grossly intact throughout PSYCH:  Cognitively intact, oriented to person place and time  ASSESSMENT AND PLAN

## 2010-06-27 NOTE — Patient Instructions (Signed)
Your physician recommends that you schedule a follow-up appointment in: 2 MONTHS  Your physician has recommended you make the following change in your medication: INCREASE Enalapril to 10mg  take one tablet by mouth twice a day

## 2010-06-27 NOTE — Assessment & Plan Note (Signed)
He has agreed to be in the REVEAL trial which will randomize to either 80 or 20 mg Lipitor plus a drug  (Anacetrapib) to increase HDL.  His lipids will be followed in this trial

## 2010-06-28 ENCOUNTER — Encounter: Payer: Self-pay | Admitting: Cardiology

## 2010-06-28 ENCOUNTER — Encounter (INDEPENDENT_AMBULATORY_CARE_PROVIDER_SITE_OTHER): Payer: Self-pay

## 2010-06-28 DIAGNOSIS — R0989 Other specified symptoms and signs involving the circulatory and respiratory systems: Secondary | ICD-10-CM

## 2010-07-18 ENCOUNTER — Other Ambulatory Visit: Payer: Self-pay | Admitting: Cardiology

## 2010-09-02 ENCOUNTER — Encounter: Payer: Self-pay | Admitting: Cardiology

## 2010-09-02 ENCOUNTER — Ambulatory Visit (INDEPENDENT_AMBULATORY_CARE_PROVIDER_SITE_OTHER): Payer: Self-pay | Admitting: Cardiology

## 2010-09-02 DIAGNOSIS — I2510 Atherosclerotic heart disease of native coronary artery without angina pectoris: Secondary | ICD-10-CM

## 2010-09-02 DIAGNOSIS — I4891 Unspecified atrial fibrillation: Secondary | ICD-10-CM

## 2010-09-02 DIAGNOSIS — E785 Hyperlipidemia, unspecified: Secondary | ICD-10-CM

## 2010-09-02 DIAGNOSIS — I509 Heart failure, unspecified: Secondary | ICD-10-CM

## 2010-09-02 NOTE — Patient Instructions (Signed)
Please schedule a 2 D Echo the same day as your next research study appointment. Continue your current medications. Follow up with Dr Antoine Poche in 3 months.

## 2010-09-02 NOTE — Assessment & Plan Note (Signed)
He continues to be followed in REVEAL

## 2010-09-02 NOTE — Assessment & Plan Note (Signed)
He will continue with risk reduction. 

## 2010-09-02 NOTE — Assessment & Plan Note (Signed)
I will leave the medications as they are as he has had a little lightheadedness and his blood pressure is somewhat low. I will repeat an echocardiogram in one month.

## 2010-09-02 NOTE — Progress Notes (Signed)
HPI The patient presents for followup of coronary disease and cardiomyopathy.  At the last visit I increased his ACE inhibitor. He did well with this. He has had no lightheadedness, presyncope or syncope. He has had no chest pressure, neck or arm discomfort. He denies any shortness of breath, PND or orthopnea. He is active on his job and also walking for exercise   No Known Allergies  Current Outpatient Prescriptions  Medication Sig Dispense Refill  . acetaminophen (TYLENOL) 325 MG tablet Take 650 mg by mouth every 6 (six) hours as needed.        Marland Kitchen aspirin 325 MG tablet Take 325 mg by mouth daily.        Marland Kitchen atorvastatin (LIPITOR) 20 MG tablet Take 20 mg by mouth daily.        . carvedilol (COREG) 12.5 MG tablet TAKE ONE TABLET BY MOUTH TWICE DAILY  60 tablet  4  . enalapril (VASOTEC) 10 MG tablet Take 1 tablet (10 mg total) by mouth 2 (two) times daily.  60 tablet  11  . furosemide (LASIX) 40 MG tablet TAKE ONE TABLET BY MOUTH EVERY DAY  30 tablet  11  . glimepiride (AMARYL) 4 MG tablet Take 4 mg by mouth daily before breakfast.        . isosorbide mononitrate (IMDUR) 60 MG 24 hr tablet TAKE ONE TABLET BY MOUTH EVERY DAY  30 tablet  11  . metFORMIN (GLUCOPHAGE) 500 MG tablet Take 500 mg by mouth 2 (two) times daily with a meal.        . nitroGLYCERIN (NITROSTAT) 0.4 MG SL tablet Place 0.4 mg under the tongue every 5 (five) minutes as needed.        . NON FORMULARY Study Drug       . prasugrel (EFFIENT) 10 MG TABS Take 10 mg by mouth daily.        Marland Kitchen spironolactone (ALDACTONE) 25 MG tablet Take 12.5 mg by mouth daily.        Marland Kitchen DISCONTD: simvastatin (ZOCOR) 40 MG tablet Take 40 mg by mouth at bedtime.          Past Medical History  Diagnosis Date  . CHF 12/14/2009    EF 35%  . Non-ST elevated myocardial infarction (non-STEMI)     November 15, 1999 anterior MI LAD 80% ostial, 70% proximal followed by total occlusion, ramus intermediate total occlusion circumflex 80%, PDA 90% lesions.  Overlapping drug-eluting stents to the LAD    No past surgical history on file.  ROS  As stated in the HPI and negative for all other systems.  PHYSICAL EXAM BP 112/78  Pulse 61  Resp 16  Ht 5\' 7"  (1.702 m)  Wt 233 lb (105.688 kg)  BMI 36.49 kg/m2 GENERAL:  Well appearing HEENT:  Pupils equal round and reactive, fundi not visualized, oral mucosa unremarkable NECK:  No jugular venous distention, waveform within normal limits, carotid upstroke brisk and symmetric, no bruits, no thyromegaly LYMPHATICS:  No cervical, inguinal adenopathy LUNGS:  Clear to auscultation bilaterally BACK:  No CVA tenderness CHEST:  Unremarkable HEART:  PMI not displaced or sustained,S1 and S2 within normal limits, no S3, no S4, no clicks, no rubs, no murmurs ABD:  Flat, positive bowel sounds normal in frequency in pitch, no bruits, no rebound, no guarding, no midline pulsatile mass, no hepatomegaly, no splenomegaly, obese EXT:  2 plus pulses throughout, no edema, no cyanosis no clubbing SKIN:  No rashes no nodules NEURO:  Cranial nerves  II through XII grossly intact, motor grossly intact throughout [facility]:  Cognitively intact, oriented to person place and time  EKG:  Sinus rhythm rate 61, old anterior infarct, no acute ST T wave changes.  ASSESSMENT AND PLAN

## 2010-09-06 ENCOUNTER — Ambulatory Visit (HOSPITAL_COMMUNITY): Payer: Self-pay | Attending: Family Medicine

## 2010-09-06 DIAGNOSIS — I509 Heart failure, unspecified: Secondary | ICD-10-CM | POA: Insufficient documentation

## 2010-09-06 DIAGNOSIS — I059 Rheumatic mitral valve disease, unspecified: Secondary | ICD-10-CM | POA: Insufficient documentation

## 2010-09-06 DIAGNOSIS — E785 Hyperlipidemia, unspecified: Secondary | ICD-10-CM | POA: Insufficient documentation

## 2010-09-06 DIAGNOSIS — I079 Rheumatic tricuspid valve disease, unspecified: Secondary | ICD-10-CM | POA: Insufficient documentation

## 2010-09-06 DIAGNOSIS — I379 Nonrheumatic pulmonary valve disorder, unspecified: Secondary | ICD-10-CM | POA: Insufficient documentation

## 2010-09-06 DIAGNOSIS — I252 Old myocardial infarction: Secondary | ICD-10-CM | POA: Insufficient documentation

## 2010-09-09 NOTE — Progress Notes (Signed)
EF is slightly better than previous.  Continue the current therapy.

## 2010-11-24 ENCOUNTER — Encounter: Payer: Self-pay | Admitting: Cardiology

## 2010-11-24 ENCOUNTER — Ambulatory Visit (INDEPENDENT_AMBULATORY_CARE_PROVIDER_SITE_OTHER): Payer: Self-pay | Admitting: Cardiology

## 2010-11-24 DIAGNOSIS — I509 Heart failure, unspecified: Secondary | ICD-10-CM

## 2010-11-24 DIAGNOSIS — I2510 Atherosclerotic heart disease of native coronary artery without angina pectoris: Secondary | ICD-10-CM

## 2010-11-24 DIAGNOSIS — I4891 Unspecified atrial fibrillation: Secondary | ICD-10-CM

## 2010-11-24 DIAGNOSIS — E663 Overweight: Secondary | ICD-10-CM

## 2010-11-24 DIAGNOSIS — E785 Hyperlipidemia, unspecified: Secondary | ICD-10-CM

## 2010-11-24 MED ORDER — FUROSEMIDE 20 MG PO TABS: 20.0000 mg | ORAL_TABLET | Freq: Every day | ORAL | Status: DC

## 2010-11-24 NOTE — Assessment & Plan Note (Signed)
He has had no arrhythmias.  No change in therapy is indicated.

## 2010-11-24 NOTE — Assessment & Plan Note (Signed)
The patient has no new sypmtoms.  No further cardiovascular testing is indicated.  We will continue with aggressive risk reduction and meds as listed.  

## 2010-11-24 NOTE — Assessment & Plan Note (Signed)
His EF is improved.  I will try to reduce his Lasix to 20 mg daily.  He will otherwise continue meds as listed.

## 2010-11-24 NOTE — Patient Instructions (Signed)
Please decrease Lasix to 20 mg a day. Continue all other medications as listed.  Follow up in 1 year with Dr Antoine Poche.  You will receive a letter in the mail 2 months before you are due.  Please call us when you receive this letter to schedule your follow up appointment.

## 2010-11-24 NOTE — Progress Notes (Signed)
HPI The patient presents for followup of coronary disease and cardiomyopathy.   Since I last saw him he has done well.  The patient denies any new symptoms such as chest discomfort, neck or arm discomfort. There has been no new shortness of breath, PND or orthopnea. There have been no reported palpitations, presyncope or syncope.  He has had trouble at times with his blood sugar dropping.  Of note on echo earlier this year his EF was much improved   No Known Allergies  Current Outpatient Prescriptions  Medication Sig Dispense Refill  . acetaminophen (TYLENOL) 325 MG tablet Take 650 mg by mouth every 6 (six) hours as needed.        Marland Kitchen aspirin 325 MG tablet Take 325 mg by mouth daily.        Marland Kitchen atorvastatin (LIPITOR) 20 MG tablet Take 20 mg by mouth daily.        . carvedilol (COREG) 12.5 MG tablet TAKE ONE TABLET BY MOUTH TWICE DAILY  60 tablet  4  . enalapril (VASOTEC) 10 MG tablet Take 1 tablet (10 mg total) by mouth 2 (two) times daily.  60 tablet  11  . furosemide (LASIX) 40 MG tablet TAKE ONE TABLET BY MOUTH EVERY DAY  30 tablet  11  . glimepiride (AMARYL) 4 MG tablet Take 4 mg by mouth daily before breakfast.        . isosorbide mononitrate (IMDUR) 60 MG 24 hr tablet TAKE ONE TABLET BY MOUTH EVERY DAY  30 tablet  11  . metFORMIN (GLUCOPHAGE) 500 MG tablet Take 500 mg by mouth 2 (two) times daily with a meal.        . nitroGLYCERIN (NITROSTAT) 0.4 MG SL tablet Place 0.4 mg under the tongue every 5 (five) minutes as needed.        . NON FORMULARY Study Drug       . prasugrel (EFFIENT) 10 MG TABS Take 10 mg by mouth daily.        Marland Kitchen spironolactone (ALDACTONE) 25 MG tablet Take 12.5 mg by mouth daily.          Past Medical History  Diagnosis Date  . CHF 12/14/2009    EF 35% at the time of MI.  50% echo 6/12  . CAD (coronary artery disease)     November 14, 2009 anterior MI LAD 80% ostial, 70% proximal followed by total occlusion, ramus intermediate total occlusion circumflex 80%, PDA 90%  lesions. Overlapping drug-eluting stents to the LAD  . Overweight   . Diabetes in pregnancy   . HTN (hypertension)   . Hyperlipidemia     No past surgical history on file.  ROS  As stated in the HPI and negative for all other systems.  PHYSICAL EXAM BP 135/81  Pulse 64  Ht 5\' 8"  (1.727 m)  Wt 238 lb (107.956 kg)  BMI 36.19 kg/m2 GENERAL:  Well appearing HEENT:  Pupils equal round and reactive, fundi not visualized, oral mucosa unremarkable NECK:  No jugular venous distention, waveform within normal limits, carotid upstroke brisk and symmetric, no bruits, no thyromegaly LYMPHATICS:  No cervical, inguinal adenopathy LUNGS:  Clear to auscultation bilaterally BACK:  No CVA tenderness CHEST:  Unremarkable HEART:  PMI not displaced or sustained,S1 and S2 within normal limits, no S3, no S4, no clicks, no rubs, no murmurs ABD:  Flat, positive bowel sounds normal in frequency in pitch, no bruits, no rebound, no guarding, no midline pulsatile mass, no hepatomegaly, no splenomegaly, obese EXT:  2 plus pulses throughout, no edema, no cyanosis no clubbing SKIN:  No rashes no nodules NEURO:  Cranial nerves II through XII grossly intact, motor grossly intact throughout PSYCH:  Cognitively intact, oriented to person place and time  EKG:  Normal sinus rhythm, rate of 62,  old anteroseptal, lateral T wave inversion.  No change from previous.  ASSESSMENT AND PLAN

## 2010-11-24 NOTE — Assessment & Plan Note (Signed)
He is being followed in the REVEAL study and I will follow up on recent labs.

## 2010-11-24 NOTE — Assessment & Plan Note (Signed)
The patient understands the need to lose weight with diet and exercise. We have discussed specific strategies for this.  

## 2010-11-30 ENCOUNTER — Other Ambulatory Visit: Payer: Self-pay | Admitting: Cardiovascular Disease

## 2011-05-30 ENCOUNTER — Other Ambulatory Visit: Payer: Self-pay | Admitting: Cardiovascular Disease

## 2011-11-11 ENCOUNTER — Other Ambulatory Visit: Payer: Self-pay | Admitting: Cardiology

## 2011-11-14 NOTE — Telephone Encounter (Signed)
..   Requested Prescriptions   Pending Prescriptions Disp Refills  . furosemide (LASIX) 20 MG tablet [Pharmacy Med Name: FUROSEMIDE 20MG      TAB] 16 tablet 0    Sig: TAKE ONE TABLET BY MOUTH EVERY DAY  . spironolactone (ALDACTONE) 25 MG tablet [Pharmacy Med Name: SPIRONOLACT 25MG     TAB] 16 tablet 0    Sig: TAKE ONE-HALF TABLET BY MOUTH EVERY DAY

## 2011-11-17 ENCOUNTER — Encounter (HOSPITAL_COMMUNITY): Payer: Self-pay | Admitting: *Deleted

## 2011-11-17 ENCOUNTER — Emergency Department (HOSPITAL_COMMUNITY)
Admission: EM | Admit: 2011-11-17 | Discharge: 2011-11-18 | Disposition: A | Discharge status: Home / Self Care | Payer: Self-pay | Attending: Emergency Medicine | Admitting: Emergency Medicine

## 2011-11-17 DIAGNOSIS — I10 Essential (primary) hypertension: Secondary | ICD-10-CM | POA: Insufficient documentation

## 2011-11-17 DIAGNOSIS — W268XXA Contact with other sharp object(s), not elsewhere classified, initial encounter: Secondary | ICD-10-CM | POA: Insufficient documentation

## 2011-11-17 DIAGNOSIS — Z7982 Long term (current) use of aspirin: Secondary | ICD-10-CM | POA: Insufficient documentation

## 2011-11-17 DIAGNOSIS — E785 Hyperlipidemia, unspecified: Secondary | ICD-10-CM | POA: Insufficient documentation

## 2011-11-17 DIAGNOSIS — I509 Heart failure, unspecified: Secondary | ICD-10-CM | POA: Insufficient documentation

## 2011-11-17 DIAGNOSIS — Y9389 Activity, other specified: Secondary | ICD-10-CM | POA: Insufficient documentation

## 2011-11-17 DIAGNOSIS — Y998 Other external cause status: Secondary | ICD-10-CM | POA: Insufficient documentation

## 2011-11-17 DIAGNOSIS — E119 Type 2 diabetes mellitus without complications: Secondary | ICD-10-CM | POA: Insufficient documentation

## 2011-11-17 DIAGNOSIS — I2510 Atherosclerotic heart disease of native coronary artery without angina pectoris: Secondary | ICD-10-CM | POA: Insufficient documentation

## 2011-11-17 DIAGNOSIS — T07XXXA Unspecified multiple injuries, initial encounter: Secondary | ICD-10-CM

## 2011-11-17 DIAGNOSIS — Z79899 Other long term (current) drug therapy: Secondary | ICD-10-CM | POA: Insufficient documentation

## 2011-11-17 DIAGNOSIS — S61209A Unspecified open wound of unspecified finger without damage to nail, initial encounter: Secondary | ICD-10-CM | POA: Insufficient documentation

## 2011-11-17 NOTE — ED Notes (Signed)
Pt cut right ring and pinky finger on grill

## 2011-11-18 MED ORDER — TETANUS-DIPHTH-ACELL PERTUSSIS 5-2.5-18.5 LF-MCG/0.5 IM SUSP
0.5000 mL | Freq: Once | INTRAMUSCULAR | Status: AC
  Administered 2011-11-18: 0.5 mL via INTRAMUSCULAR
  Filled 2011-11-18: qty 0.5

## 2011-11-18 MED ORDER — TETANUS-DIPHTH-ACELL PERTUSSIS 5-2.5-18.5 LF-MCG/0.5 IM SUSP
INTRAMUSCULAR | Status: AC
  Filled 2011-11-18: qty 0.5

## 2011-11-18 MED ORDER — LIDOCAINE HCL (PF) 1 % IJ SOLN
INTRAMUSCULAR | Status: AC
  Administered 2011-11-18
  Filled 2011-11-18: qty 10

## 2011-11-18 MED ORDER — IBUPROFEN 800 MG PO TABS
800.0000 mg | ORAL_TABLET | Freq: Once | ORAL | Status: AC
  Administered 2011-11-18: 800 mg via ORAL
  Filled 2011-11-18: qty 1

## 2011-11-18 NOTE — ED Notes (Signed)
Pt stable at discharge Verbalized understanding of wound care 

## 2011-11-18 NOTE — ED Notes (Signed)
MD at bedside.Laceration being sutured by MD

## 2011-11-18 NOTE — ED Provider Notes (Signed)
Medical screening examination/treatment/procedure(s) were conducted as a shared visit with non-physician practitioner(s) and myself.  I personally evaluated the patient during the encounter  Justin [name]. Colon Branch, MD 11/18/11 1610

## 2011-11-18 NOTE — ED Notes (Signed)
Pt has 2 lacerations; 1st on R ring finger 2nd deeper laceration on R pinky finger; bleeding controled with pressure

## 2011-11-18 NOTE — ED Provider Notes (Signed)
I was asked by the EDP to repair lacerations involved the right fourth and fifth fingers.  This was my only involvement in this patient's care.     #1 LACERATION REPAIR Performed by: Venie Montesinos L. Authorized by: Maxwell Caul Consent: Verbal consent obtained. Risks and benefits: risks, benefits and alternatives were discussed Consent given by: patient Patient identity confirmed: provided demographic data Prepped and Draped in normal sterile fashion Wound explored  Laceration Location: right fourth finger Laceration Length: 2 cm  No Foreign Bodies seen or palpated  Anesthesia: local infiltration  Local anesthetic: lidocaine 1% w/o epinephrine  Anesthetic total: 2 ml  Irrigation method: syringe Amount of cleaning: standard  Skin closure: 4-0 prolene Number of sutures: 3  Technique: simple interrupted  Patient tolerance: Patient tolerated the procedure well with no immediate complications.   #2 LACERATION REPAIR Performed by: Adaijah Endres L. Authorized by: Maxwell Caul Consent: Verbal consent obtained. Risks and benefits: risks, benefits and alternatives were discussed Consent given by: patient Patient identity confirmed: provided demographic data Prepped and Draped in normal sterile fashion Wound explored  Laceration Location: right fifth finger Laceration Length: 3.5 cm  No Foreign Bodies seen or palpated  Anesthesia: local infiltration  Local anesthetic: lidocaine 1 % w/o epinephrine  Anesthetic total: 3 ml  Irrigation method: syringe Amount of cleaning: standard  Skin closure: 9 Number of sutures: 4-0 prolene Technique: simple interrupted  Patient tolerance: Patient tolerated the procedure well with no immediate complications.    Wound(s) explored with adequate hemostasis through ROM, no apparent gross foreign body retained, no significant involvement of deep structures such as bone / joint / tendon / or neurovascular involvement  noted.  Baseline Strength and Sensation to affected extremity(ies) with normal light touch for Pt, distal NVI with CR< 2 secs and pulse(s) intact to affected extremity(ies).   Cyrilla Durkin L. Mikias Lanz, [place] 11/18/11 1610

## 2011-11-18 NOTE — ED Provider Notes (Signed)
History     CSN: [id number]  Arrival date & time 11/17/11  2325   First MD Initiated Contact with Patient 11/17/11 2350      Chief Complaint  Patient presents with  . Extremity Laceration    (Consider location/radiation/quality/duration/timing/severity/associated sxs/prior treatment) HPI Justin [name] is a 51 y.o. male who presents to the Emergency Department complaining of laceration to right finger and little finger on right hand sustained on some part of his grill as he went to light it. Tetanus is not up to date. Past Medical History  Diagnosis Date  . CHF 12/14/2009    EF 35% at the time of MI.  50% echo 6/12  . CAD (coronary artery disease)     November 14, 2009 anterior MI LAD 80% ostial, 70% proximal followed by total occlusion, ramus intermediate total occlusion circumflex 80%, PDA 90% lesions. Overlapping drug-eluting stents to the LAD  . Overweight   . Diabetes mellitus   . HTN (hypertension)   . Hyperlipidemia     History reviewed. No pertinent past surgical history.  Family History  Problem Relation Age of Onset  . Coronary artery disease    . Heart attack    . Cardiomyopathy    . Heart disease      History  Substance Use Topics  . Smoking status: Never Smoker   . Smokeless tobacco: Never Used  . Alcohol Use: No      Review of Systems  Constitutional: Negative for fever.       10 Systems reviewed and are negative for acute change except as noted in the HPI.  HENT: Negative for congestion.   Eyes: Negative for discharge and redness.  Respiratory: Negative for cough and shortness of breath.   Cardiovascular: Negative for chest pain.  Gastrointestinal: Negative for vomiting and abdominal pain.  Musculoskeletal: Negative for back pain.  Skin: Negative for rash.       Lacerations to 4th and 5th fingers right hand  Neurological: Negative for syncope, numbness and headaches.  Psychiatric/Behavioral:       No behavior change.    Allergies  Review  of patient's allergies indicates no known allergies.  Home Medications   Current Outpatient Rx  Name Route Sig Dispense Refill  . ASPIRIN 325 MG PO TABS Oral Take 325 mg by mouth daily.      . ATORVASTATIN CALCIUM 20 MG PO TABS Oral Take 20 mg by mouth daily.      Marland Kitchen CARVEDILOL 12.5 MG PO TABS  TAKE ONE TABLET BY MOUTH TWICE DAILY 60 tablet 4  . EFFIENT 10 MG PO TABS  TAKE ONE TABLET BY MOUTH EVERY DAY 30 each 5  . ENALAPRIL MALEATE 10 MG PO TABS Oral Take 1 tablet (10 mg total) by mouth 2 (two) times daily. 60 tablet 11  . FUROSEMIDE 20 MG PO TABS  TAKE ONE TABLET BY MOUTH EVERY DAY 16 tablet 0  . ISOSORBIDE MONONITRATE ER 60 MG PO TB24  TAKE ONE TABLET BY MOUTH EVERY DAY 30 tablet 11  . METFORMIN HCL 500 MG PO TABS Oral Take 500 mg by mouth 2 (two) times daily with a meal.      . NITROGLYCERIN 0.4 MG SL SUBL Sublingual Place 0.4 mg under the tongue every 5 (five) minutes as needed.      . NON FORMULARY  Study Drug     . SPIRONOLACTONE 25 MG PO TABS  TAKE ONE-HALF TABLET BY MOUTH EVERY DAY 16 tablet 0    .Marland Kitchen  Patient needs to keep Appointment  for future re ...  . ACETAMINOPHEN 325 MG PO TABS Oral Take 650 mg by mouth every 6 (six) hours as needed.        BP 149/118  Pulse 78  Temp 98.3 F (36.8 C) (Oral)  Resp 20  Ht 5\' 9"  (1.753 m)  Wt 237 lb (107.502 kg)  BMI 35.00 kg/m2  SpO2 98%  Physical Exam  Nursing note and vitals reviewed. Constitutional:       Awake, alert, nontoxic appearance.  HENT:  Head: Atraumatic.  Eyes: Right eye exhibits no discharge. Left eye exhibits no discharge.  Neck: Neck supple.  Pulmonary/Chest: Effort normal. He exhibits no tenderness.  Abdominal: Soft. There is no tenderness. There is no rebound.  Musculoskeletal: He exhibits no tenderness.       Baseline ROM, no obvious new focal weakness.  Neurological:       Mental status and motor strength appears baseline for patient and situation.  Skin: No rash noted.       Lacerations to 4th and 5th  fingers on right hand. 4th finger with 3 cm laceration, 5th finger with 2 cm laceration  Psychiatric: He has a normal mood and affect.    ED Course  Procedures (including critical care time)   No diagnosis found.    MDM  Patient with lacerations to 4th and 5th right fingers sustained when he was attempting to light his grill. Tetanus updated. Mil level provider repaired lacerations. Pt stable in ED with no significant deterioration in condition.The patient appears reasonably screened and/or stabilized for discharge and I doubt any other medical condition or other [facility] requiring further screening, evaluation, or treatment in the ED at this time prior to discharge.  MDM Reviewed: nursing note and vitals           Nicoletta Dress. Colon Branch, MD 11/18/11 7829

## 2011-11-18 NOTE — ED Notes (Signed)
Pt sitting up in bed while fingers are being sutured by PA No distress noted tolerating well

## 2011-11-29 ENCOUNTER — Ambulatory Visit (INDEPENDENT_AMBULATORY_CARE_PROVIDER_SITE_OTHER): Payer: Self-pay | Admitting: Cardiology

## 2011-11-29 ENCOUNTER — Encounter: Payer: Self-pay | Admitting: Cardiology

## 2011-11-29 VITALS — BP 144/87 | HR 64 | Ht 68.0 in | Wt 242.8 lb

## 2011-11-29 DIAGNOSIS — I2510 Atherosclerotic heart disease of native coronary artery without angina pectoris: Secondary | ICD-10-CM

## 2011-11-29 DIAGNOSIS — I4891 Unspecified atrial fibrillation: Secondary | ICD-10-CM

## 2011-11-29 DIAGNOSIS — E785 Hyperlipidemia, unspecified: Secondary | ICD-10-CM

## 2011-11-29 DIAGNOSIS — I509 Heart failure, unspecified: Secondary | ICD-10-CM

## 2011-11-29 LAB — BASIC METABOLIC PANEL
BUN: 17 mg/dL (ref 6–23)
Calcium: 9.2 mg/dL (ref 8.4–10.5)
GFR: 94.1 mL/min (ref >60.00)
Potassium: 4.1 mEq/L (ref 3.5–5.1)

## 2011-11-29 NOTE — Patient Instructions (Addendum)
Please stop your Effient. Continue all other medications as listed.  Please have blood work today (BMP).  Follow up in 1 year with Dr Antoine Poche.  You will receive a letter in the mail 2 months before you are due.  Please call us when you receive this letter to schedule your follow up appointment.

## 2011-11-29 NOTE — Progress Notes (Signed)
HPI The patient presents for followup of coronary disease.   Since I last saw him he has done well.  The patient denies any new symptoms such as chest discomfort, neck or arm discomfort. There has been no new shortness of breath, PND or orthopnea. There have been no reported palpitations, presyncope or syncope.  He works very hard. He walks for exercise. He doesn't have any symptoms with this. He did have some hand cramping recently and was started on some potassium. He has not had blood work followup as far as he knows.   No Known Allergies  Current Outpatient Prescriptions  Medication Sig Dispense Refill  . acetaminophen (TYLENOL) 325 MG tablet Take 650 mg by mouth every 6 (six) hours as needed.        Marland Kitchen aspirin 325 MG tablet Take 325 mg by mouth daily.        Marland Kitchen atorvastatin (LIPITOR) 20 MG tablet Take 20 mg by mouth daily.        . carvedilol (COREG) 12.5 MG tablet TAKE ONE TABLET BY MOUTH TWICE DAILY  60 tablet  4  . EFFIENT 10 MG TABS TAKE ONE TABLET BY MOUTH EVERY DAY  30 each  5  . enalapril (VASOTEC) 10 MG tablet Take 1 tablet (10 mg total) by mouth 2 (two) times daily.  60 tablet  11  . furosemide (LASIX) 20 MG tablet TAKE ONE TABLET BY MOUTH EVERY DAY  16 tablet  0  . isosorbide mononitrate (IMDUR) 60 MG 24 hr tablet TAKE ONE TABLET BY MOUTH EVERY DAY  30 tablet  11  . metFORMIN (GLUCOPHAGE) 500 MG tablet Take 500 mg by mouth 2 (two) times daily with a meal.        . nitroGLYCERIN (NITROSTAT) 0.4 MG SL tablet Place 0.4 mg under the tongue every 5 (five) minutes as needed.        . NON FORMULARY Study Drug       . spironolactone (ALDACTONE) 25 MG tablet TAKE ONE-HALF TABLET BY MOUTH EVERY DAY  16 tablet  0    Past Medical History  Diagnosis Date  . CHF 12/14/2009    EF 35% at the time of MI.  50% echo 6/12  . CAD (coronary artery disease)     November 14, 2009 anterior MI LAD 80% ostial, 70% proximal followed by total occlusion, ramus intermediate total occlusion circumflex 80%,  PDA 90% lesions. Overlapping drug-eluting stents to the LAD  . Overweight   . Diabetes mellitus   . HTN (hypertension)   . Hyperlipidemia     No past surgical history on file.  ROS  As stated in the HPI and negative for all other systems.  PHYSICAL EXAM BP 144/87  Pulse 64  Ht 5\' 8"  (1.727 m)  Wt 242 lb 12.8 oz (110.133 kg)  BMI 36.92 kg/m2 GENERAL:  Well appearing HEENT:  Pupils equal round and reactive, fundi not visualized, oral mucosa unremarkable NECK:  No jugular venous distention, waveform within normal limits, carotid upstroke brisk and symmetric, no bruits, no thyromegaly LYMPHATICS:  No cervical, inguinal adenopathy LUNGS:  Clear to auscultation bilaterally BACK:  No CVA tenderness CHEST:  Unremarkable HEART:  PMI not displaced or sustained,S1 and S2 within normal limits, no S3, no S4, no clicks, no rubs, no murmurs ABD:  Flat, positive bowel sounds normal in frequency in pitch, no bruits, no rebound, no guarding, no midline pulsatile mass, no hepatomegaly, no splenomegaly, obese EXT:  2 plus pulses throughout, no  edema, no cyanosis no clubbing SKIN:  No rashes no nodules NEURO:  Cranial nerves II through XII grossly intact, motor grossly intact throughout PSYCH:  Cognitively intact, oriented to person place and time  EKG:  Normal sinus rhythm, rate of 64,  old anteroseptal, lateral T wave inversion.  No change from previous. 11/29/2011   ASSESSMENT AND PLAN  CAD -  The patient has no new sypmtoms. No further cardiovascular testing is indicated. We will continue with aggressive risk reduction and meds as listed.   CHF -  He tolerated reduced Lasix last year. I will continue the current meds. His ejection fraction improved from 35-50%. No further evaluation is warranted.  HYPERLIPIDEMIA -  He is being followed in the REVEAL study.  Overweight -  The patient understands the need to lose weight with diet and exercise. We have discussed specific strategies for  this.  Cramps - I will check a BMET since he is on potassium and spironolactone.  He should have his BMET checked about 3 times per year.  I will defer to [name], MD

## 2011-12-12 ENCOUNTER — Other Ambulatory Visit: Payer: Self-pay | Admitting: Cardiology

## 2011-12-13 NOTE — Telephone Encounter (Signed)
..   Requested Prescriptions   Pending Prescriptions Disp Refills  . furosemide (LASIX) 20 MG tablet [Pharmacy Med Name: FUROSEMIDE 20MG      TAB] 90 tablet 3    Sig: TAKE ONE TABLET BY MOUTH EVERY DAY  . spironolactone (ALDACTONE) 25 MG tablet [Pharmacy Med Name: SPIRONOLACT 25MG     TAB] 45 tablet 3    Sig: TAKE ONE-HALF TABLET BY MOUTH EVERY DAY

## 2012-04-27 ENCOUNTER — Other Ambulatory Visit: Payer: Self-pay

## 2013-01-16 ENCOUNTER — Other Ambulatory Visit: Payer: Self-pay

## 2013-07-10 ENCOUNTER — Ambulatory Visit (INDEPENDENT_AMBULATORY_CARE_PROVIDER_SITE_OTHER): Payer: BC Managed Care – PPO | Admitting: Cardiology

## 2013-07-10 ENCOUNTER — Encounter: Payer: Self-pay | Admitting: Cardiology

## 2013-07-10 VITALS — BP 140/85 | HR 73 | Ht 68.0 in | Wt 260.4 lb

## 2013-07-10 DIAGNOSIS — I2510 Atherosclerotic heart disease of native coronary artery without angina pectoris: Secondary | ICD-10-CM

## 2013-07-10 DIAGNOSIS — I509 Heart failure, unspecified: Secondary | ICD-10-CM

## 2013-07-10 DIAGNOSIS — I4891 Unspecified atrial fibrillation: Secondary | ICD-10-CM

## 2013-07-10 MED ORDER — NITROGLYCERIN 0.4 MG SL SUBL: 0.4000 mg | SUBLINGUAL_TABLET | SUBLINGUAL | Status: AC | PRN

## 2013-07-10 NOTE — Progress Notes (Signed)
HPI The patient presents for followup of coronary disease.   Since I last saw him he has done well.  The patient denies any new symptoms such as chest discomfort, neck or arm discomfort. There has been no new shortness of breath, PND or orthopnea. There have been no reported palpitations, presyncope or syncope.  He works very hard.  He doesn't have any symptoms with this. He has none of the cramping that he had before. He has gained about 30 pounds over a couple of years.  He has had Norvasc added recently to his meds as his BP continues to be elevated.      No Known Allergies  Current Outpatient Prescriptions  Medication Sig Dispense Refill  . acetaminophen (TYLENOL) 325 MG tablet Take 650 mg by mouth every 6 (six) hours as needed.        Marland Kitchen. amLODipine (NORVASC) 5 MG tablet daily.      Marland Kitchen. aspirin 325 MG tablet Take 325 mg by mouth daily.        . carvedilol (COREG) 12.5 MG tablet TAKE ONE TABLET BY MOUTH TWICE DAILY  60 tablet  4  . enalapril (VASOTEC) 10 MG tablet Take 1 tablet (10 mg total) by mouth 2 (two) times daily.  60 tablet  11  . furosemide (LASIX) 20 MG tablet TAKE ONE TABLET BY MOUTH EVERY DAY  90 tablet  3  . glimepiride (AMARYL) 4 MG tablet daily.      . isosorbide mononitrate (IMDUR) 60 MG 24 hr tablet TAKE ONE TABLET BY MOUTH EVERY DAY  30 tablet  11  . metFORMIN (GLUCOPHAGE) 1000 MG tablet Take 1,000 mg by mouth 2 (two) times daily with a meal.      . nitroGLYCERIN (NITROSTAT) 0.4 MG SL tablet Place 0.4 mg under the tongue every 5 (five) minutes as needed.        . NON FORMULARY Study Drug       . spironolactone (ALDACTONE) 25 MG tablet TAKE ONE-HALF TABLET BY MOUTH EVERY DAY  45 tablet  3   No current facility-administered medications for this visit.    Past Medical History  Diagnosis Date  . CHF 12/14/2009    EF 35% at the time of MI.  50% echo 6/12  . CAD (coronary artery disease)     November 14, 2009 anterior MI LAD 80% ostial, 70% proximal followed by total  occlusion, ramus intermediate total occlusion circumflex 80%, PDA 90% lesions. Overlapping drug-eluting stents to the LAD  . Overweight   . Diabetes mellitus   . HTN (hypertension)   . Hyperlipidemia     No past surgical history on file.  ROS  As stated in the HPI and negative for all other systems.  PHYSICAL EXAM BP 140/85  Pulse 73  Ht 5\' 8"  (1.727 m)  Wt 260 lb 6.4 oz (118.117 kg)  BMI 39.60 kg/m2 GENERAL:  Well appearing HEENT:  Pupils equal round and reactive, fundi not visualized, oral mucosa unremarkable NECK:  No jugular venous distention, waveform within normal limits, carotid upstroke brisk and symmetric, no bruits, no thyromegaly LYMPHATICS:  No cervical, inguinal adenopathy LUNGS:  Clear to auscultation bilaterally BACK:  No CVA tenderness CHEST:  Unremarkable HEART:  PMI not displaced or sustained,S1 and S2 within normal limits, no S3, no S4, no clicks, no rubs, no murmurs ABD:  Flat, positive bowel sounds normal in frequency in pitch, no bruits, no rebound, no guarding, no midline pulsatile mass, no hepatomegaly, no splenomegaly, obese  EXT:  2 plus pulses throughout, no edema, no cyanosis no clubbing SKIN:  No rashes no nodules NEURO:  Cranial nerves II through XII grossly intact, motor grossly intact throughout PSYCH:  Cognitively intact, oriented to person place and time  EKG:  Normal sinus rhythm, rate of 72,  old anteroseptal, lateral T wave inversion.  No change from previous. 07/10/2013   ASSESSMENT AND PLAN  CAD -  The patient has no new sypmtoms. No further cardiovascular testing is indicated. We will continue with aggressive risk reduction and meds as listed.   CHF -  He is euvolemic.   I will continue the current meds. His ejection fraction improved from 35 to50%. No further evaluation is warranted.  HYPERLIPIDEMIA -  He is being followed in the REVEAL study.  Overweight -  The patient understands the need to lose weight with diet and exercise. We  have discussed specific strategies for this.  This is his most significant issue I believe at this point.  I tried to educate him again.   Cramps - He is no longer bothered by this.

## 2013-07-10 NOTE — Patient Instructions (Signed)
The current medical regimen is effective;  continue present plan and medications.  Follow up in 1 year with Dr Hochrein.  You will receive a letter in the mail 2 months before you are due.  Please call us when you receive this letter to schedule your follow up appointment.  

## 2013-11-03 ENCOUNTER — Encounter (HOSPITAL_COMMUNITY): Payer: Self-pay | Admitting: Emergency Medicine

## 2013-11-03 ENCOUNTER — Emergency Department (HOSPITAL_COMMUNITY)
Admission: EM | Admit: 2013-11-03 | Discharge: 2013-11-03 | Disposition: A | Discharge status: Home / Self Care | Payer: BC Managed Care – PPO | Attending: Emergency Medicine | Admitting: Emergency Medicine

## 2013-11-03 DIAGNOSIS — M5430 Sciatica, unspecified side: Secondary | ICD-10-CM | POA: Insufficient documentation

## 2013-11-03 DIAGNOSIS — E119 Type 2 diabetes mellitus without complications: Secondary | ICD-10-CM | POA: Insufficient documentation

## 2013-11-03 DIAGNOSIS — I509 Heart failure, unspecified: Secondary | ICD-10-CM | POA: Insufficient documentation

## 2013-11-03 DIAGNOSIS — M5432 Sciatica, left side: Secondary | ICD-10-CM

## 2013-11-03 DIAGNOSIS — E663 Overweight: Secondary | ICD-10-CM | POA: Insufficient documentation

## 2013-11-03 DIAGNOSIS — Z7982 Long term (current) use of aspirin: Secondary | ICD-10-CM | POA: Insufficient documentation

## 2013-11-03 DIAGNOSIS — I2510 Atherosclerotic heart disease of native coronary artery without angina pectoris: Secondary | ICD-10-CM | POA: Insufficient documentation

## 2013-11-03 DIAGNOSIS — Z79899 Other long term (current) drug therapy: Secondary | ICD-10-CM | POA: Diagnosis not present

## 2013-11-03 DIAGNOSIS — I10 Essential (primary) hypertension: Secondary | ICD-10-CM | POA: Insufficient documentation

## 2013-11-03 DIAGNOSIS — M25559 Pain in unspecified hip: Secondary | ICD-10-CM | POA: Diagnosis present

## 2013-11-03 MED ORDER — HYDROCODONE-ACETAMINOPHEN 5-325 MG PO TABS: 2.0000 | ORAL_TABLET | Freq: Once | ORAL | Status: DC

## 2013-11-03 MED ORDER — HYDROCODONE-ACETAMINOPHEN 5-325 MG PO TABS: 1.0000 | ORAL_TABLET | ORAL | Status: AC | PRN

## 2013-11-03 MED ORDER — MORPHINE SULFATE 4 MG/ML IJ SOLN
6.0000 mg | Freq: Once | INTRAMUSCULAR | Status: AC
  Administered 2013-11-03: 6 mg via INTRAMUSCULAR
  Filled 2013-11-03: qty 2

## 2013-11-03 MED ORDER — NAPROXEN 500 MG PO TABS: 500.0000 mg | ORAL_TABLET | Freq: Two times a day (BID) | ORAL | Status: AC

## 2013-11-03 MED ORDER — KETOROLAC TROMETHAMINE 60 MG/2ML IM SOLN
60.0000 mg | Freq: Once | INTRAMUSCULAR | Status: AC
  Administered 2013-11-03: 60 mg via INTRAMUSCULAR
  Filled 2013-11-03: qty 2

## 2013-11-03 MED ORDER — HYDROCODONE-ACETAMINOPHEN 5-325 MG PO TABS: 2.0000 | ORAL_TABLET | ORAL | Status: DC | PRN

## 2013-11-03 NOTE — ED Notes (Signed)
Pt states pain to left hip has been in left hip for approximately 10 years with no know cause has progressively and intermittently gotten worse. No recent flare ups until last night. Took 2 aleve for relief 2 hours ago with minimal results

## 2013-11-03 NOTE — Discharge Instructions (Signed)
If you were given medicines take as directed.  If you are on coumadin or contraceptives realize their levels and effectiveness is altered by many different medicines.  If you have any reaction (rash, tongues swelling, other) to the medicines stop taking and see a physician.   Please follow up as directed and return to the ER or see a physician for new or worsening symptoms.  Thank you. Filed Vitals:   11/03/13 0257  BP: 155/93  Temp: 98.3 F (36.8 C)  TempSrc: Oral  Height:  (1.778 m)  Weight: 265 lb (120.203 kg)  SpO2: 97%

## 2013-11-03 NOTE — ED Provider Notes (Signed)
CSN: [id number]     Arrival date & time 11/03/13  0244 History   First MD Initiated Contact with Patient 11/03/13 0247     Chief Complaint  Patient presents with  . Hip Pain     (Consider location/radiation/quality/duration/timing/severity/associated sxs/prior Treatment) HPI Comments: 53 year old male with history of lipids, CAD, atrial fibrillation, heart failure presents with severe left buttock pain radiating down the left leg posteriorly. Patient had this partially 10 years ago that resolved. No history of back surgeries, patient denies weakness or numbness or bowel or bladder changes. No active cancer. No focal back pain. Symptoms constant and worse with straightening the leg. Patient to 2 L with minimal improvement. Symptoms started late last evening.  Patient is a 53 y.o. male presenting with hip pain. The history is provided by the patient.  Hip Pain    Past Medical History  Diagnosis Date  . CHF 12/14/2009    EF 35% at the time of MI.  50% echo 6/12  . CAD (coronary artery disease)     November 14, 2009 anterior MI LAD 80% ostial, 70% proximal followed by total occlusion, ramus intermediate total occlusion circumflex 80%, PDA 90% lesions. Overlapping drug-eluting stents to the LAD  . Overweight(278.02)   . Diabetes mellitus   . HTN (hypertension)   . Hyperlipidemia    History reviewed. No pertinent past surgical history. Family History  Problem Relation Age of Onset  . Coronary artery disease    . Heart attack    . Cardiomyopathy    . Heart disease     History  Substance Use Topics  . Smoking status: Never Smoker   . Smokeless tobacco: Never Used  . Alcohol Use: No    Review of Systems    Allergies  Review of patient's allergies indicates no known allergies.  Home Medications   Prior to Admission medications   Medication Sig Start Date End Date Taking? Authorizing Provider  acetaminophen (TYLENOL) 325 MG tablet Take 650 mg by mouth every 6 (six) hours as  needed.      Historical Provider, MD  amLODipine (NORVASC) 5 MG tablet daily. 05/19/13   Historical Provider, MD  aspirin 325 MG tablet Take 325 mg by mouth daily.      Historical Provider, MD  carvedilol (COREG) 12.5 MG tablet TAKE ONE TABLET BY MOUTH TWICE DAILY 07/18/10   Rollene Rotunda, MD  enalapril (VASOTEC) 10 MG tablet Take 1 tablet (10 mg total) by mouth 2 (two) times daily. 06/27/10   Rollene Rotunda, MD  furosemide (LASIX) 20 MG tablet TAKE ONE TABLET BY MOUTH EVERY DAY 12/12/11   Rollene Rotunda, MD  glimepiride (AMARYL) 4 MG tablet daily. 05/19/13   Historical Provider, MD  isosorbide mononitrate (IMDUR) 60 MG 24 hr tablet TAKE ONE TABLET BY MOUTH EVERY DAY 06/09/10   Kathleene Hazel, MD  metFORMIN (GLUCOPHAGE) 1000 MG tablet Take 1,000 mg by mouth 2 (two) times daily with a meal.    Historical Provider, MD  nitroGLYCERIN (NITROSTAT) 0.4 MG SL tablet Place 1 tablet (0.4 mg total) under the tongue every 5 (five) minutes as needed. 07/10/13   Rollene Rotunda, MD  NON FORMULARY Study Drug     Historical Provider, MD  spironolactone (ALDACTONE) 25 MG tablet TAKE ONE-HALF TABLET BY MOUTH EVERY DAY 12/12/11   Rollene Rotunda, MD   BP 155/93  Temp(Src) 98.3 F (36.8 C) (Oral)  Ht  (1.778 m)  Wt 265 lb (120.203 kg)  BMI 38.02 kg/m2  SpO2  97% Physical Exam  Nursing note and vitals reviewed. Constitutional: He is oriented to person, place, and time. He appears well-developed and well-nourished.  HENT:  Head: Normocephalic and atraumatic.  Eyes: Right eye exhibits no discharge. Left eye exhibits no discharge.  Neck: Normal range of motion. Neck supple. No tracheal deviation present.  Cardiovascular: Normal rate, regular rhythm and intact distal pulses.   Pulmonary/Chest: Effort normal.  Musculoskeletal: He exhibits tenderness. He exhibits no edema.  Patient has mild tenderness with straining left leg deep palpation of left box  Neurological: He is alert and oriented to person, place,  and time.  Reflex Scores:      Patellar reflexes are 1+ on the right side and 1+ on the left side.      Achilles reflexes are 1+ on the right side and 1+ on the left side. Patient has 5+ flexion extension of hips knees and great toes bilateral. Sensation intact bilateral lower extremities in major nerves.  Skin: Skin is warm. No rash noted.  Psychiatric: He has a normal mood and affect.    ED Course  Procedures (including critical care time) Labs Review Labs Reviewed - No data to display  Imaging Review No results found.   EKG Interpretation None      MDM   Final diagnoses:  None   Clinically sciatica without focal weakness.  Patient's pain improved significantly on recheck. Normal neuro exam. Followup outpatient discussed and reasons to return discussed.  Results and differential diagnosis were discussed with the patient/parent/guardian. Close follow up outpatient was discussed, comfortable with the plan.   Medications  ketorolac (TORADOL) injection 60 mg (60 mg Intramuscular Given 11/03/13 0316)  morphine 4 MG/ML injection 6 mg (6 mg Intramuscular Given 11/03/13 0317)    Filed Vitals:   11/03/13 0257  BP: 155/93  Temp: 98.3 F (36.8 C)  TempSrc: Oral  Height:  (1.778 m)  Weight: 265 lb (120.203 kg)  SpO2: 97%       Enid Skeens, MD 11/03/13 [phone]

## 2013-11-04 MED FILL — Hydrocodone-Acetaminophen Tab 5-325 MG: ORAL | Qty: 6 | Status: AC

## 2013-11-18 ENCOUNTER — Emergency Department (HOSPITAL_COMMUNITY)
Admission: EM | Admit: 2013-11-18 | Discharge: 2013-11-18 | Disposition: A | Discharge status: Home / Self Care | Payer: BC Managed Care – PPO | Attending: Emergency Medicine | Admitting: Emergency Medicine

## 2013-11-18 ENCOUNTER — Encounter (HOSPITAL_COMMUNITY): Payer: Self-pay | Admitting: Emergency Medicine

## 2013-11-18 ENCOUNTER — Emergency Department (HOSPITAL_COMMUNITY): Payer: BC Managed Care – PPO

## 2013-11-18 DIAGNOSIS — I10 Essential (primary) hypertension: Secondary | ICD-10-CM | POA: Insufficient documentation

## 2013-11-18 DIAGNOSIS — E663 Overweight: Secondary | ICD-10-CM | POA: Insufficient documentation

## 2013-11-18 DIAGNOSIS — I509 Heart failure, unspecified: Secondary | ICD-10-CM | POA: Diagnosis not present

## 2013-11-18 DIAGNOSIS — E119 Type 2 diabetes mellitus without complications: Secondary | ICD-10-CM | POA: Diagnosis not present

## 2013-11-18 DIAGNOSIS — W1809XA Striking against other object with subsequent fall, initial encounter: Secondary | ICD-10-CM | POA: Insufficient documentation

## 2013-11-18 DIAGNOSIS — S4980XA Other specified injuries of shoulder and upper arm, unspecified arm, initial encounter: Secondary | ICD-10-CM | POA: Diagnosis present

## 2013-11-18 DIAGNOSIS — Z79899 Other long term (current) drug therapy: Secondary | ICD-10-CM | POA: Insufficient documentation

## 2013-11-18 DIAGNOSIS — Z791 Long term (current) use of non-steroidal anti-inflammatories (NSAID): Secondary | ICD-10-CM | POA: Diagnosis not present

## 2013-11-18 DIAGNOSIS — Y9389 Activity, other specified: Secondary | ICD-10-CM | POA: Diagnosis not present

## 2013-11-18 DIAGNOSIS — Z7982 Long term (current) use of aspirin: Secondary | ICD-10-CM | POA: Insufficient documentation

## 2013-11-18 DIAGNOSIS — Y9289 Other specified places as the place of occurrence of the external cause: Secondary | ICD-10-CM | POA: Insufficient documentation

## 2013-11-18 DIAGNOSIS — I2510 Atherosclerotic heart disease of native coronary artery without angina pectoris: Secondary | ICD-10-CM | POA: Diagnosis not present

## 2013-11-18 DIAGNOSIS — S46911A Strain of unspecified muscle, fascia and tendon at shoulder and upper arm level, right arm, initial encounter: Secondary | ICD-10-CM

## 2013-11-18 DIAGNOSIS — IMO0002 Reserved for concepts with insufficient information to code with codable children: Secondary | ICD-10-CM | POA: Insufficient documentation

## 2013-11-18 DIAGNOSIS — S46909A Unspecified injury of unspecified muscle, fascia and tendon at shoulder and upper arm level, unspecified arm, initial encounter: Secondary | ICD-10-CM | POA: Insufficient documentation

## 2013-11-18 MED ORDER — HYDROCODONE-ACETAMINOPHEN 5-325 MG PO TABS
1.0000 | ORAL_TABLET | ORAL | Status: AC | PRN
Start: 2013-11-18 — End: ?

## 2013-11-18 MED ORDER — KETOROLAC TROMETHAMINE 10 MG PO TABS
10.0000 mg | ORAL_TABLET | Freq: Once | ORAL | Status: AC
  Administered 2013-11-18: 10 mg via ORAL
  Filled 2013-11-18: qty 1

## 2013-11-18 MED ORDER — HYDROCODONE-ACETAMINOPHEN 5-325 MG PO TABS
2.0000 | ORAL_TABLET | Freq: Once | ORAL | Status: AC
  Administered 2013-11-18: 2 via ORAL
  Filled 2013-11-18: qty 2

## 2013-11-18 MED ORDER — MELOXICAM 15 MG PO TABS: 15.0000 mg | ORAL_TABLET | Freq: Every day | ORAL | Status: AC

## 2013-11-18 NOTE — ED Notes (Addendum)
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  Hurts to raise right arm, pt states he hit the shoulder against bar and now shoulder is "stiff". States he took 2(200mg ) ibuprofen prior to arrival in ED.

## 2013-11-18 NOTE — Discharge Instructions (Signed)
And a please see Dr. Hilda Lias or the used of your choice as sone as possible for evaluation of your shoulder. Your x-ray is negative for fracture or dislocation. I am  concerned about your decrease in range of motion of her shoulder. please use the shoulder immobilizer until seen by orthopedics.  Muscle Strain A muscle strain is an injury that occurs when a muscle is stretched beyond its normal length. Usually a small number of muscle fibers are torn when this happens. Muscle strain is rated in degrees. First-degree strains have the least amount of muscle fiber tearing and pain. Second-degree and third-degree strains have increasingly more tearing and pain.  Usually, recovery from muscle strain takes 1-2 weeks. Complete healing takes 5-6 weeks.  CAUSES  Muscle strain happens when a sudden, violent force placed on a muscle stretches it too far. This may occur with lifting, sports, or a fall.  RISK FACTORS Muscle strain is especially common in athletes.  SIGNS AND SYMPTOMS At the site of the muscle strain, there may be:  Pain.  Bruising.  Swelling.  Difficulty using the muscle due to pain or lack of normal function. DIAGNOSIS  Your health care provider will perform a physical exam and ask about your medical history. TREATMENT  Often, the best treatment for a muscle strain is resting, icing, and applying cold compresses to the injured area.  HOME CARE INSTRUCTIONS   Use the PRICE method of treatment to promote muscle healing during the first 2-3 days after your injury. The PRICE method involves:  Protecting the muscle from being injured again.  Restricting your activity and resting the injured body part.  Icing your injury. To do this, put ice in a plastic bag. Place a towel between your skin and the bag. Then, apply the ice and leave it on from 15-20 minutes each hour. After the third day, switch to moist heat packs.  Apply compression to the injured area with a splint or elastic  bandage. Be careful not to wrap it too tightly. This may interfere with blood circulation or increase swelling.  Elevate the injured body part above the level of your heart as often as you can.  Only take over-the-counter or prescription medicines for pain, discomfort, or fever as directed by your health care provider.  Warming up prior to exercise helps to prevent future muscle strains. SEEK MEDICAL CARE IF:   You have increasing pain or swelling in the injured area.  You have numbness, tingling, or a significant loss of strength in the injured area. MAKE SURE YOU:   Understand these instructions.  Will watch your condition.  Will get help right away if you are not doing well or get worse. Document Released: 02/27/2005 Document Revised: 12/18/2012 Document Reviewed: 09/26/2012 [facility] Patient Information 2015 [place], [place]. This information is not intended to replace advice given to you by your health care provider. Make sure you discuss any questions you have with your health care provider.

## 2013-11-18 NOTE — ED Provider Notes (Signed)
CSN: [id number]     Arrival date & time 11/18/13  1818 History   First MD Initiated Contact with Patient 11/18/13 2039     Chief Complaint  Patient presents with  . 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 He complains that now he cannot raise his right arm above his head. He has an aching, shooting pain involving the right shoulder.  Patient is a 53 y.o. male presenting with shoulder pain. The history is provided by the patient.  Shoulder Pain This is a new problem. The current episode started today. The problem occurs constantly. The problem has been gradually worsening. Associated symptoms include arthralgias. Pertinent negatives include no abdominal pain, chest pain, coughing, joint swelling, neck pain or numbness. Exacerbated by: raising the right arm.    Past Medical History  Diagnosis Date  . CHF 12/14/2009    EF 35% at the time of MI.  50% echo 6/12  . CAD (coronary artery disease)     November 14, 2009 anterior MI LAD 80% ostial, 70% proximal followed by total occlusion, ramus intermediate total occlusion circumflex 80%, PDA 90% lesions. Overlapping drug-eluting stents to the LAD  . Overweight(278.02)   . Diabetes mellitus   . HTN (hypertension)   . Hyperlipidemia    History reviewed. No pertinent past surgical history. Family History  Problem Relation Age of Onset  . Coronary artery disease    . Heart attack    . Cardiomyopathy    . Heart disease     History  Substance Use Topics  . Smoking status: Never Smoker   . Smokeless tobacco: Never Used  . Alcohol Use: No    Review of Systems  Constitutional: Negative for activity change.       All ROS Neg except as noted in HPI  HENT: Negative.   Eyes: Negative for photophobia and discharge.  Respiratory: Negative for cough, shortness of breath  and wheezing.   Cardiovascular: Negative for chest pain and palpitations.  Gastrointestinal: Negative for abdominal pain and blood in stool.  Endocrine: Negative.   Genitourinary: Negative for dysuria, frequency and hematuria.  Musculoskeletal: Positive for arthralgias. Negative for back pain, joint swelling and neck pain.  Skin: Negative.   Allergic/Immunologic: Negative.   Neurological: Negative for dizziness, seizures, speech difficulty and numbness.  Psychiatric/Behavioral: Negative for hallucinations and confusion.      Allergies  Review of patient's allergies indicates no known allergies.  Home Medications   Prior to Admission medications   Medication Sig Start Date End Date Taking? Authorizing Provider  amLODipine (NORVASC) 5 MG tablet Take 5 mg by mouth daily.  05/19/13  Yes Historical Provider, MD  aspirin 325 MG tablet Take 325 mg by mouth daily.     Yes Historical Provider, MD  carvedilol (COREG) 12.5 MG tablet Take 12.5 mg by mouth 2 (two) times daily with a meal.   Yes Historical Provider, MD  enalapril (VASOTEC) 10 MG tablet Take 1 tablet (10 mg total) by mouth 2 (two) times daily. 06/27/10  Yes Rollene Rotunda, MD  furosemide (LASIX) 20 MG tablet Take 20 mg by mouth daily.   Yes Historical Provider, MD  glimepiride (AMARYL) 4 MG tablet Take 4 mg by mouth daily.  05/19/13  Yes Historical Provider, MD  HYDROcodone-acetaminophen (NORCO) 5-325 MG per tablet Take 1-2 tablets by mouth every 4 (four) hours as needed. 11/03/13  Yes  Enid Skeens, MD  isosorbide mononitrate (IMDUR) 60 MG 24 hr tablet Take 60 mg by mouth daily.   Yes Historical Provider, MD  metFORMIN (GLUCOPHAGE) 1000 MG tablet Take 1,000 mg by mouth 2 (two) times daily with a meal.   Yes Historical Provider, MD  naproxen (NAPROSYN) 500 MG tablet Take 1 tablet (500 mg total) by mouth 2 (two) times daily. 11/03/13  Yes Enid Skeens, MD  NON FORMULARY Take 1 tablet by mouth daily. Study Drug   Yes Historical Provider,  MD  spironolactone (ALDACTONE) 25 MG tablet Take 12.5 mg by mouth daily.   Yes Historical Provider, MD  acetaminophen (TYLENOL) 325 MG tablet Take 650 mg by mouth every 6 (six) hours as needed.      Historical Provider, MD  nitroGLYCERIN (NITROSTAT) 0.4 MG SL tablet Place 1 tablet (0.4 mg total) under the tongue every 5 (five) minutes as needed. 07/10/13   Rollene Rotunda, MD   BP 137/85  Pulse 75  Temp(Src) 98 F (36.7 C) (Oral)  Resp 18  Ht  (1.727 m)  Wt 265 lb (120.203 kg)  BMI 40.30 kg/m2  SpO2 98% Physical Exam  Nursing note and vitals reviewed. Constitutional: He is oriented to person, place, and time. He appears well-developed and well-nourished.  Non-toxic appearance.  HENT:  Head: Normocephalic.  Right Ear: Tympanic membrane and external ear normal.  Left Ear: Tympanic membrane and external ear normal.  Eyes: EOM and lids are normal. Pupils are equal, round, and reactive to light.  Neck: Normal range of motion. Neck supple. Carotid bruit is not present.  Cardiovascular: Normal rate, regular rhythm, normal heart sounds, intact distal pulses and normal pulses.   Pulmonary/Chest: Breath sounds normal. No respiratory distress.  Abdominal: Soft. Bowel sounds are normal. There is no tenderness. There is no guarding.  Musculoskeletal: Normal range of motion. He exhibits tenderness.  Pt can't  raise the right arm above his head. Pain with attempted ROM of the right shoulder. No dislocation. No deformity. Distal pulses 2+ bilat..  Lymphadenopathy:       Head (right side): No submandibular adenopathy present.       Head (left side): No submandibular adenopathy present.    He has no cervical adenopathy.  Neurological: He is alert and oriented to person, place, and time. He has normal strength. No cranial nerve deficit or sensory deficit. He exhibits normal muscle tone. Coordination normal.  Skin: Skin is warm and dry.  Psychiatric: He has a normal mood and affect. His speech is  normal.    ED Course  Procedures (including critical care time) Labs Review Labs Reviewed - No data to display  Imaging Review Dg Shoulder Right  11/18/2013   CLINICAL DATA:  Right shoulder trauma.  EXAM: RIGHT SHOULDER - 2+ VIEW  COMPARISON:  None.  FINDINGS: Acromioclavicular and glenohumeral degenerative change. No evidence of fracture, dislocation, or separation.  IMPRESSION: DJD, no acute abnormality.   Electronically Signed   By: Maisie Fus  Register   On: 11/18/2013 19:47     EKG Interpretation None      MDM  Vital signs are well within normal limits. The history, examination and vital signs to support an infectious process. There is no evidence of deformity or dislocation on the examination. X-ray of the right shoulder confirms no dislocation or fracture. There is evidence of some degenerative joint disease involving the right shoulder.  The patient will be placed in a shoulder immobilizer. We given Mobic and Norco for  pain. He is referred to orthopedics for evaluation concerning the shoulder, in particular to rule out rotator cuff injury.    Final diagnoses:  None    **I have reviewed nursing notes, vital signs, and all appropriate lab and imaging results for this patient.Kathie Dike, PA-C 11/21/13 1006

## 2013-11-21 NOTE — ED Provider Notes (Signed)
Medical screening examination/treatment/procedure(s) were performed by non-physician practitioner and as supervising physician I was immediately available for consultation/collaboration.    Anisha Starliper, MD 11/21/13 2344 

## 2014-11-03 ENCOUNTER — Encounter (HOSPITAL_COMMUNITY): Admission: EM | Disposition: E | Payer: Self-pay | Source: Home / Self Care | Attending: Cardiology

## 2014-11-03 ENCOUNTER — Emergency Department (HOSPITAL_BASED_OUTPATIENT_CLINIC_OR_DEPARTMENT_OTHER): Payer: BLUE CROSS/BLUE SHIELD

## 2014-11-03 ENCOUNTER — Inpatient Hospital Stay (HOSPITAL_BASED_OUTPATIENT_CLINIC_OR_DEPARTMENT_OTHER)
Admission: EM | Admit: 2014-11-03 | Discharge: 2014-12-12 | DRG: 250 | Disposition: E | Payer: BLUE CROSS/BLUE SHIELD | Attending: Internal Medicine | Admitting: Internal Medicine

## 2014-11-03 ENCOUNTER — Encounter (HOSPITAL_BASED_OUTPATIENT_CLINIC_OR_DEPARTMENT_OTHER): Payer: Self-pay

## 2014-11-03 DIAGNOSIS — R1010 Upper abdominal pain, unspecified: Secondary | ICD-10-CM | POA: Diagnosis present

## 2014-11-03 DIAGNOSIS — I4901 Ventricular fibrillation: Secondary | ICD-10-CM | POA: Diagnosis not present

## 2014-11-03 DIAGNOSIS — K810 Acute cholecystitis: Secondary | ICD-10-CM

## 2014-11-03 DIAGNOSIS — K851 Biliary acute pancreatitis without necrosis or infection: Secondary | ICD-10-CM | POA: Diagnosis present

## 2014-11-03 DIAGNOSIS — E871 Hypo-osmolality and hyponatremia: Secondary | ICD-10-CM | POA: Diagnosis not present

## 2014-11-03 DIAGNOSIS — K8010 Calculus of gallbladder with chronic cholecystitis without obstruction: Secondary | ICD-10-CM | POA: Diagnosis present

## 2014-11-03 DIAGNOSIS — T82867A Thrombosis of cardiac prosthetic devices, implants and grafts, initial encounter: Secondary | ICD-10-CM | POA: Diagnosis present

## 2014-11-03 DIAGNOSIS — N189 Chronic kidney disease, unspecified: Secondary | ICD-10-CM | POA: Diagnosis present

## 2014-11-03 DIAGNOSIS — I129 Hypertensive chronic kidney disease with stage 1 through stage 4 chronic kidney disease, or unspecified chronic kidney disease: Secondary | ICD-10-CM | POA: Diagnosis present

## 2014-11-03 DIAGNOSIS — J9601 Acute respiratory failure with hypoxia: Secondary | ICD-10-CM | POA: Diagnosis not present

## 2014-11-03 DIAGNOSIS — K567 Ileus, unspecified: Secondary | ICD-10-CM | POA: Diagnosis present

## 2014-11-03 DIAGNOSIS — I213 ST elevation (STEMI) myocardial infarction of unspecified site: Secondary | ICD-10-CM | POA: Diagnosis not present

## 2014-11-03 DIAGNOSIS — Z7982 Long term (current) use of aspirin: Secondary | ICD-10-CM | POA: Diagnosis not present

## 2014-11-03 DIAGNOSIS — Z8249 Family history of ischemic heart disease and other diseases of the circulatory system: Secondary | ICD-10-CM | POA: Diagnosis not present

## 2014-11-03 DIAGNOSIS — R740 Nonspecific elevation of levels of transaminase and lactic acid dehydrogenase [LDH]: Secondary | ICD-10-CM | POA: Diagnosis present

## 2014-11-03 DIAGNOSIS — E785 Hyperlipidemia, unspecified: Secondary | ICD-10-CM | POA: Diagnosis present

## 2014-11-03 DIAGNOSIS — I2102 ST elevation (STEMI) myocardial infarction involving left anterior descending coronary artery: Secondary | ICD-10-CM | POA: Diagnosis not present

## 2014-11-03 DIAGNOSIS — R140 Abdominal distension (gaseous): Secondary | ICD-10-CM | POA: Diagnosis not present

## 2014-11-03 DIAGNOSIS — I5042 Chronic combined systolic (congestive) and diastolic (congestive) heart failure: Secondary | ICD-10-CM | POA: Diagnosis not present

## 2014-11-03 DIAGNOSIS — R1013 Epigastric pain: Secondary | ICD-10-CM | POA: Diagnosis not present

## 2014-11-03 DIAGNOSIS — E876 Hypokalemia: Secondary | ICD-10-CM | POA: Diagnosis not present

## 2014-11-03 DIAGNOSIS — N179 Acute kidney failure, unspecified: Secondary | ICD-10-CM | POA: Diagnosis not present

## 2014-11-03 DIAGNOSIS — E1165 Type 2 diabetes mellitus with hyperglycemia: Secondary | ICD-10-CM | POA: Diagnosis present

## 2014-11-03 DIAGNOSIS — T508X5A Adverse effect of diagnostic agents, initial encounter: Secondary | ICD-10-CM | POA: Diagnosis not present

## 2014-11-03 DIAGNOSIS — I8291 Chronic embolism and thrombosis of unspecified vein: Secondary | ICD-10-CM | POA: Diagnosis present

## 2014-11-03 DIAGNOSIS — G92 Toxic encephalopathy: Secondary | ICD-10-CM | POA: Diagnosis not present

## 2014-11-03 DIAGNOSIS — R7881 Bacteremia: Secondary | ICD-10-CM | POA: Diagnosis not present

## 2014-11-03 DIAGNOSIS — E1122 Type 2 diabetes mellitus with diabetic chronic kidney disease: Secondary | ICD-10-CM | POA: Diagnosis present

## 2014-11-03 DIAGNOSIS — D696 Thrombocytopenia, unspecified: Secondary | ICD-10-CM | POA: Diagnosis not present

## 2014-11-03 DIAGNOSIS — K858 Other acute pancreatitis: Secondary | ICD-10-CM | POA: Diagnosis not present

## 2014-11-03 DIAGNOSIS — K7200 Acute and subacute hepatic failure without coma: Secondary | ICD-10-CM | POA: Diagnosis not present

## 2014-11-03 DIAGNOSIS — I2510 Atherosclerotic heart disease of native coronary artery without angina pectoris: Secondary | ICD-10-CM | POA: Diagnosis present

## 2014-11-03 DIAGNOSIS — I509 Heart failure, unspecified: Secondary | ICD-10-CM

## 2014-11-03 DIAGNOSIS — B9620 Unspecified Escherichia coli [E. coli] as the cause of diseases classified elsewhere: Secondary | ICD-10-CM | POA: Diagnosis not present

## 2014-11-03 DIAGNOSIS — R0902 Hypoxemia: Secondary | ICD-10-CM

## 2014-11-03 DIAGNOSIS — I5043 Acute on chronic combined systolic (congestive) and diastolic (congestive) heart failure: Secondary | ICD-10-CM | POA: Diagnosis present

## 2014-11-03 DIAGNOSIS — G4733 Obstructive sleep apnea (adult) (pediatric): Secondary | ICD-10-CM | POA: Diagnosis present

## 2014-11-03 DIAGNOSIS — K859 Acute pancreatitis without necrosis or infection, unspecified: Secondary | ICD-10-CM | POA: Diagnosis present

## 2014-11-03 DIAGNOSIS — R0600 Dyspnea, unspecified: Secondary | ICD-10-CM

## 2014-11-03 DIAGNOSIS — I252 Old myocardial infarction: Secondary | ICD-10-CM | POA: Diagnosis not present

## 2014-11-03 DIAGNOSIS — Z452 Encounter for adjustment and management of vascular access device: Secondary | ICD-10-CM

## 2014-11-03 DIAGNOSIS — I2129 ST elevation (STEMI) myocardial infarction involving other sites: Secondary | ICD-10-CM | POA: Diagnosis present

## 2014-11-03 DIAGNOSIS — R509 Fever, unspecified: Secondary | ICD-10-CM

## 2014-11-03 DIAGNOSIS — Z6841 Body Mass Index (BMI) 40.0 and over, adult: Secondary | ICD-10-CM

## 2014-11-03 DIAGNOSIS — R109 Unspecified abdominal pain: Secondary | ICD-10-CM

## 2014-11-03 DIAGNOSIS — I493 Ventricular premature depolarization: Secondary | ICD-10-CM | POA: Diagnosis not present

## 2014-11-03 DIAGNOSIS — I10 Essential (primary) hypertension: Secondary | ICD-10-CM | POA: Diagnosis not present

## 2014-11-03 DIAGNOSIS — K5900 Constipation, unspecified: Secondary | ICD-10-CM | POA: Diagnosis present

## 2014-11-03 DIAGNOSIS — Z955 Presence of coronary angioplasty implant and graft: Secondary | ICD-10-CM | POA: Diagnosis not present

## 2014-11-03 DIAGNOSIS — Z9861 Coronary angioplasty status: Secondary | ICD-10-CM

## 2014-11-03 DIAGNOSIS — E119 Type 2 diabetes mellitus without complications: Secondary | ICD-10-CM | POA: Diagnosis not present

## 2014-11-03 DIAGNOSIS — T82857D Stenosis of cardiac prosthetic devices, implants and grafts, subsequent encounter: Secondary | ICD-10-CM | POA: Diagnosis not present

## 2014-11-03 HISTORY — DX: Type 2 diabetes mellitus without complications: E11.9

## 2014-11-03 HISTORY — DX: Overweight: E66.3

## 2014-11-03 HISTORY — PX: CARDIAC CATHETERIZATION: SHX172

## 2014-11-03 HISTORY — DX: Atherosclerotic heart disease of native coronary artery without angina pectoris: I25.10

## 2014-11-03 HISTORY — DX: ST elevation (STEMI) myocardial infarction involving other coronary artery of anterior wall: I21.09

## 2014-11-03 HISTORY — DX: Coronary angioplasty status: Z98.61

## 2014-11-03 LAB — COMPREHENSIVE METABOLIC PANEL
ALT: 116 U/L — AB (ref 17–63)
AST: 155 U/L — ABNORMAL HIGH (ref 15–41)
Albumin: 4.3 g/dL (ref 3.5–5.0)
Alkaline Phosphatase: 65 U/L (ref 38–126)
Anion gap: 10 (ref 5–15)
BILIRUBIN TOTAL: 2.1 mg/dL — AB (ref 0.3–1.2)
BUN: 18 mg/dL (ref 6–20)
CALCIUM: 9.3 mg/dL (ref 8.9–10.3)
CHLORIDE: 103 mmol/L (ref 101–111)
CO2: 24 mmol/L (ref 22–32)
CREATININE: 1.19 mg/dL (ref 0.61–1.24)
Glucose, Bld: 274 mg/dL — ABNORMAL HIGH (ref 65–99)
Potassium: 4.6 mmol/L (ref 3.5–5.1)
Sodium: 137 mmol/L (ref 135–145)
TOTAL PROTEIN: 7.6 g/dL (ref 6.5–8.1)

## 2014-11-03 LAB — APTT: APTT: 23 s — AB (ref 24–37)

## 2014-11-03 LAB — CBC WITH DIFFERENTIAL/PLATELET
Basophils Absolute: 0 10*3/uL (ref 0.0–0.1)
Basophils Relative: 0 % (ref 0–1)
EOS PCT: 0 % (ref 0–5)
Eosinophils Absolute: 0 10*3/uL (ref 0.0–0.7)
HCT: 47.5 % (ref 39.0–52.0)
Hemoglobin: 16.4 g/dL (ref 13.0–17.0)
LYMPHS ABS: 1.6 10*3/uL (ref 0.7–4.0)
Lymphocytes Relative: 8 % — ABNORMAL LOW (ref 12–46)
MCH: 31.2 pg (ref 26.0–34.0)
MCHC: 34.5 g/dL (ref 30.0–36.0)
MCV: 90.3 fL (ref 78.0–100.0)
MONOS PCT: 4 % (ref 3–12)
Monocytes Absolute: 0.8 10*3/uL (ref 0.1–1.0)
NEUTROS ABS: 18 10*3/uL — AB (ref 1.7–7.7)
Neutrophils Relative %: 88 % — ABNORMAL HIGH (ref 43–77)
PLATELETS: 263 10*3/uL (ref 150–400)
RBC: 5.26 MIL/uL (ref 4.22–5.81)
RDW: 12.9 % (ref 11.5–15.5)
WBC: 20.4 10*3/uL — ABNORMAL HIGH (ref 4.0–10.5)

## 2014-11-03 LAB — I-STAT CG4 LACTIC ACID, ED: Lactic Acid, Venous: 2.18 mmol/L (ref 0.5–2.0)

## 2014-11-03 LAB — CBG MONITORING, ED: Glucose-Capillary: 245 mg/dL — ABNORMAL HIGH (ref 65–99)

## 2014-11-03 LAB — PROTIME-INR
INR: 0.96 (ref 0.00–1.49)
PROTHROMBIN TIME: 13 s (ref 11.6–15.2)

## 2014-11-03 LAB — LIPASE, BLOOD: Lipase: >3000 U/L — ABNORMAL HIGH (ref 22–51)

## 2014-11-03 LAB — TROPONIN I

## 2014-11-03 SURGERY — LEFT HEART CATH AND CORONARY ANGIOGRAPHY
Anesthesia: LOCAL

## 2014-11-03 MED ORDER — TICAGRELOR 90 MG PO TABS
180.0000 mg | ORAL_TABLET | Freq: Once | ORAL | Status: AC
  Administered 2014-11-04: 180 mg via ORAL
  Filled 2014-11-03: qty 2

## 2014-11-03 MED ORDER — SODIUM CHLORIDE 0.9 % WEIGHT BASED INFUSION: 1.0000 mL/kg/h | INTRAVENOUS | Status: DC

## 2014-11-03 MED ORDER — SODIUM CHLORIDE 0.9 % IJ SOLN: 3.0000 mL | INTRAMUSCULAR | Status: DC | PRN

## 2014-11-03 MED ORDER — SODIUM CHLORIDE 0.9 % IV SOLN
1.7500 mg/kg/h | INTRAVENOUS | Status: DC
  Filled 2014-11-03: qty 250

## 2014-11-03 MED ORDER — SODIUM CHLORIDE 0.9 % IV SOLN: 250.0000 mL | INTRAVENOUS | Status: DC | PRN

## 2014-11-03 MED ORDER — FUROSEMIDE 10 MG/ML IJ SOLN
40.0000 mg | Freq: Once | INTRAMUSCULAR | Status: AC
  Administered 2014-11-04: 40 mg via INTRAVENOUS
  Filled 2014-11-03: qty 4

## 2014-11-03 MED ORDER — HEPARIN (PORCINE) IN NACL 100-0.45 UNIT/ML-% IJ SOLN
1300.0000 [IU]/h | INTRAMUSCULAR | Status: DC
  Administered 2014-11-03: 1300 [IU]/h via INTRAVENOUS
  Filled 2014-11-03: qty 250

## 2014-11-03 MED ORDER — CANGRELOR BOLUS VIA INFUSION
INTRAVENOUS | Status: DC | PRN
  Administered 2014-11-03: 3783 ug via INTRAVENOUS

## 2014-11-03 MED ORDER — SODIUM CHLORIDE 0.9 % IV SOLN
250.0000 mg | INTRAVENOUS | Status: DC | PRN
  Administered 2014-11-03 (×2): 1.75 mg/kg/h via INTRAVENOUS

## 2014-11-03 MED ORDER — FENTANYL CITRATE (PF) 100 MCG/2ML IJ SOLN
INTRAMUSCULAR | Status: DC | PRN
  Administered 2014-11-03 (×3): 25 ug via INTRAVENOUS

## 2014-11-03 MED ORDER — ASPIRIN 81 MG PO CHEW
CHEWABLE_TABLET | ORAL | Status: AC
  Administered 2014-11-03: 324 mg via ORAL
  Filled 2014-11-03: qty 4

## 2014-11-03 MED ORDER — SODIUM CHLORIDE 0.9 % IV SOLN
50000.0000 ug | INTRAVENOUS | Status: DC | PRN
  Administered 2014-11-03: 4 ug/kg/min via INTRAVENOUS

## 2014-11-03 MED ORDER — HYDROMORPHONE HCL 1 MG/ML IJ SOLN
INTRAMUSCULAR | Status: AC
  Administered 2014-11-03: 1 mg via INTRAVENOUS
  Filled 2014-11-03: qty 1

## 2014-11-03 MED ORDER — CARVEDILOL 12.5 MG PO TABS
12.5000 mg | ORAL_TABLET | Freq: Two times a day (BID) | ORAL | Status: DC
  Administered 2014-11-04: 12.5 mg via ORAL
  Filled 2014-11-03 (×3): qty 1

## 2014-11-03 MED ORDER — ONDANSETRON HCL 4 MG/2ML IJ SOLN
INTRAMUSCULAR | Status: AC
  Administered 2014-11-03: 4 mg via INTRAVENOUS
  Filled 2014-11-03: qty 2

## 2014-11-03 MED ORDER — SODIUM CHLORIDE 0.9 % IJ SOLN
3.0000 mL | Freq: Two times a day (BID) | INTRAMUSCULAR | Status: DC
  Administered 2014-11-04 – 2014-11-09 (×10): 3 mL via INTRAVENOUS

## 2014-11-03 MED ORDER — HEPARIN BOLUS VIA INFUSION: 4000.0000 [IU] | Freq: Once | INTRAVENOUS | Status: DC

## 2014-11-03 MED ORDER — ONDANSETRON HCL 4 MG/2ML IJ SOLN
4.0000 mg | Freq: Four times a day (QID) | INTRAMUSCULAR | Status: DC | PRN
  Administered 2014-11-04 (×2): 4 mg via INTRAVENOUS
  Filled 2014-11-03 (×2): qty 2

## 2014-11-03 MED ORDER — NITROGLYCERIN 1 MG/10 ML FOR IR/CATH LAB
INTRA_ARTERIAL | Status: DC | PRN
  Administered 2014-11-03: 23:00:00

## 2014-11-03 MED ORDER — IOHEXOL 350 MG/ML SOLN
INTRAVENOUS | Status: DC | PRN
  Administered 2014-11-03: 235 mL via INTRACARDIAC

## 2014-11-03 MED ORDER — HYDROMORPHONE HCL 1 MG/ML IJ SOLN
1.0000 mg | Freq: Once | INTRAMUSCULAR | Status: AC
  Administered 2014-11-03: 1 mg via INTRAVENOUS
  Filled 2014-11-03: qty 1

## 2014-11-03 MED ORDER — ACETAMINOPHEN 325 MG PO TABS: 650.0000 mg | ORAL_TABLET | ORAL | Status: DC | PRN

## 2014-11-03 MED ORDER — HEPARIN (PORCINE) IN NACL 100-0.45 UNIT/ML-% IJ SOLN
INTRAMUSCULAR | Status: AC
  Filled 2014-11-03: qty 250

## 2014-11-03 MED ORDER — NITROGLYCERIN 0.4 MG SL SUBL
0.4000 mg | SUBLINGUAL_TABLET | SUBLINGUAL | Status: DC | PRN
  Filled 2014-11-03: qty 1

## 2014-11-03 MED ORDER — BIVALIRUDIN BOLUS VIA INFUSION - CUPID
INTRAVENOUS | Status: DC | PRN
  Administered 2014-11-03: 94.575 mg via INTRAVENOUS

## 2014-11-03 MED ORDER — ONDANSETRON HCL 4 MG/2ML IJ SOLN
4.0000 mg | Freq: Once | INTRAMUSCULAR | Status: AC
  Administered 2014-11-03: 4 mg via INTRAVENOUS
  Filled 2014-11-03: qty 2

## 2014-11-03 MED ORDER — SPIRONOLACTONE 12.5 MG HALF TABLET
12.5000 mg | ORAL_TABLET | Freq: Every day | ORAL | Status: DC
  Administered 2014-11-04 – 2014-11-05 (×2): 12.5 mg via ORAL
  Filled 2014-11-03 (×2): qty 1

## 2014-11-03 MED ORDER — ENALAPRIL MALEATE 10 MG PO TABS
10.0000 mg | ORAL_TABLET | Freq: Two times a day (BID) | ORAL | Status: DC
  Administered 2014-11-04 – 2014-11-05 (×3): 10 mg via ORAL
  Filled 2014-11-03 (×5): qty 1

## 2014-11-03 MED ORDER — TIROFIBAN HCL IV 12.5 MG/250 ML
0.1500 ug/kg/min | INTRAVENOUS | Status: AC
  Administered 2014-11-04 (×2): 0.15 ug/kg/min via INTRAVENOUS
  Filled 2014-11-03 (×3): qty 250

## 2014-11-03 MED ORDER — NITROGLYCERIN 0.4 MG SL SUBL: 0.4000 mg | SUBLINGUAL_TABLET | SUBLINGUAL | Status: DC | PRN

## 2014-11-03 MED ORDER — ISOSORBIDE MONONITRATE ER 60 MG PO TB24
60.0000 mg | ORAL_TABLET | Freq: Every day | ORAL | Status: DC
  Administered 2014-11-04: 60 mg via ORAL
  Filled 2014-11-03: qty 1

## 2014-11-03 MED ORDER — SODIUM CHLORIDE 0.9 % IV SOLN: INTRAVENOUS | Status: DC

## 2014-11-03 MED ORDER — ASPIRIN 81 MG PO CHEW
324.0000 mg | CHEWABLE_TABLET | Freq: Once | ORAL | Status: AC
  Administered 2014-11-03: 324 mg via ORAL
  Filled 2014-11-03: qty 4

## 2014-11-03 MED ORDER — ASPIRIN 300 MG RE SUPP
150.0000 mg | Freq: Every day | RECTAL | Status: DC
  Filled 2014-11-03: qty 1

## 2014-11-03 MED ORDER — SODIUM CHLORIDE 0.9 % IV BOLUS (SEPSIS)
500.0000 mL | Freq: Once | INTRAVENOUS | Status: AC
  Administered 2014-11-03: 500 mL via INTRAVENOUS

## 2014-11-03 SURGICAL SUPPLY — 21 items
BALLN EMERGE MR 2.5X20 (BALLOONS) ×2
BALLN ~~LOC~~ EMERGE MR 3.0X15 (BALLOONS) ×2
BALLOON EMERGE MR 2.5X20 (BALLOONS) ×1 IMPLANT
BALLOON ~~LOC~~ EMERGE MR 3.0X15 (BALLOONS) ×1 IMPLANT
CATH INFINITI 5FR ANG PIGTAIL (CATHETERS) IMPLANT
CATH INFINITI 5FR MULTPACK ANG (CATHETERS) ×2 IMPLANT
CATH OPTITORQUE TIG 4.0 5F (CATHETERS) ×2 IMPLANT
CATH VISTA GUIDE 6FR XBLAD3.5 (CATHETERS) ×2 IMPLANT
DEVICE RAD COMP TR BAND LRG (VASCULAR PRODUCTS) IMPLANT
GLIDESHEATH SLEND A-KIT 6F 22G (SHEATH) ×2 IMPLANT
KIT ENCORE 26 ADVANTAGE (KITS) ×2 IMPLANT
KIT HEART LEFT (KITS) ×2 IMPLANT
PACK CARDIAC CATHETERIZATION (CUSTOM PROCEDURE TRAY) ×2 IMPLANT
SHEATH PINNACLE 5F 10CM (SHEATH) ×2 IMPLANT
SHEATH PINNACLE 6F 10CM (SHEATH) ×2 IMPLANT
SYR MEDRAD MARK V 150ML (SYRINGE) ×2 IMPLANT
TRANSDUCER W/STOPCOCK (MISCELLANEOUS) ×2 IMPLANT
TUBING CIL FLEX 10 FLL-RA (TUBING) ×2 IMPLANT
WIRE EMERALD 3MM-J .035X150CM (WIRE) ×2 IMPLANT
WIRE HI TORQ BMW 190CM (WIRE) ×2 IMPLANT
WIRE SAFE-T 1.5MM-J .035X260CM (WIRE) ×2 IMPLANT

## 2014-11-03 NOTE — ED Provider Notes (Signed)
CSN: [id number]     Arrival date & time 11/11/14  1948 History  This chart was scribed for Justin Crease, MD by Octavia Heir, ED Scribe. This patient was seen in room MH07/MH07 and the patient's care was started at 8:05 PM.     Chief Complaint  Patient presents with  . Abdominal Pain      The history is provided by the patient. No language interpreter was used.   HPI Comments: Justin [name] is a 54 y.o. male who has hx of CHF, CAD and HTN presents to the Emergency Department complaining of sudden onset, gradual worsening mid abdominal pain onset 6 hours ago. Pt notes having associated gradual worsening nausea. Pt notes his pain worsens when he lays down.  Pt denies hx of abdominal surgeries, hx of blockages, chest pain, vomiting, and shortness of breath.  Past Medical History  Diagnosis Date  . CHF 12/14/2009    EF 35% at the time of MI.  50% echo 6/12  . CAD S/P percutaneous coronary angioplasty     November 14, 2009 anterior MI LAD 80% ostial, 70% proximal followed by total occlusion, ramus intermediate total occlusion circumflex 80%, PDA 90% lesions. Overlapping drug-eluting stents to the LAD  . ST elevation myocardial infarction (STEMI) of anterior wall 11/13/2009  . Diabetes mellitus   . HTN (hypertension)   . Hyperlipidemia   . Diabetes mellitus   . Overweight    Past Surgical History  Procedure Laterality Date  . Cardiac catheterization N/A 11/11/14    Procedure: Left Heart Cath and Coronary Angiography;  Surgeon: Marykay Lex, MD;  Location: [facility] INVASIVE CV LAB;  Service: Cardiovascular;  Laterality: N/A;  . Cardiac catheterization N/A November 11, 2014    Procedure: Coronary Stent Intervention;  Surgeon: Marykay Lex, MD;  Location: [facility] INVASIVE CV LAB;  Service: Cardiovascular;  Laterality: N/A;   Family History  Problem Relation Age of Onset  . Coronary artery disease    . Heart attack    . Cardiomyopathy    . Heart disease     Social History  Substance Use  Topics  . Smoking status: Never Smoker   . Smokeless tobacco: Never Used  . Alcohol Use: No    Review of Systems  Respiratory: Negative for shortness of breath.   Cardiovascular: Negative for chest pain.  Gastrointestinal: Positive for nausea and abdominal pain. Negative for vomiting.  All other systems reviewed and are negative.     Allergies  Review of patient's allergies indicates no known allergies.  Home Medications   Prior to Admission medications   Medication Sig Start Date End Date Taking? Authorizing Provider  acetaminophen (TYLENOL) 325 MG tablet Take 650 mg by mouth every 6 (six) hours as needed.     Yes Historical Provider, MD  amLODipine (NORVASC) 5 MG tablet Take 5 mg by mouth daily.  05/19/13  Yes Historical Provider, MD  aspirin 325 MG tablet Take 325 mg by mouth daily.     Yes Historical Provider, MD  carvedilol (COREG) 12.5 MG tablet Take 12.5 mg by mouth 2 (two) times daily with a meal.   Yes Historical Provider, MD  enalapril (VASOTEC) 10 MG tablet Take 1 tablet (10 mg total) by mouth 2 (two) times daily. 06/27/10  Yes Rollene Rotunda, MD  furosemide (LASIX) 20 MG tablet Take 20 mg by mouth daily.   Yes Historical Provider, MD  glimepiride (AMARYL) 4 MG tablet Take 4 mg by mouth daily.  05/19/13  Yes Historical Provider,  MD  isosorbide mononitrate (IMDUR) 60 MG 24 hr tablet Take 60 mg by mouth daily.   Yes Historical Provider, MD  metFORMIN (GLUCOPHAGE) 1000 MG tablet Take 1,000 mg by mouth 2 (two) times daily with a meal.   Yes Historical Provider, MD  nitroGLYCERIN (NITROSTAT) 0.4 MG SL tablet Place 1 tablet (0.4 mg total) under the tongue every 5 (five) minutes as needed. 07/10/13  Yes Rollene Rotunda, MD  NON FORMULARY Take 1 tablet by mouth daily. Study Drug   Yes Historical Provider, MD  spironolactone (ALDACTONE) 25 MG tablet Take 12.5 mg by mouth daily.   Yes Historical Provider, MD  HYDROcodone-acetaminophen (NORCO) 5-325 MG per tablet Take 1-2 tablets by mouth  every 4 (four) hours as needed. 11/03/13   Blane Ohara, MD  HYDROcodone-acetaminophen (NORCO/VICODIN) 5-325 MG per tablet Take 1 tablet by mouth every 4 (four) hours as needed. 11/18/13   Ivery Quale, PA-C  meloxicam (MOBIC) 15 MG tablet Take 1 tablet (15 mg total) by mouth daily. 11/18/13   Ivery Quale, PA-C  naproxen (NAPROSYN) 500 MG tablet Take 1 tablet (500 mg total) by mouth 2 (two) times daily. 11/03/13   Blane Ohara, MD   Triage vitals: BP 132/77 mmHg  Pulse 77  Temp(Src) 98 F (36.7 C) (Oral)  Resp 16  Ht 5\' 9"  (1.753 m)  Wt 278 lb (126.1 kg)  BMI 41.03 kg/m2  SpO2 98% Physical Exam  Constitutional: He is oriented to person, place, and time. He appears well-developed and well-nourished. No distress.  HENT:  Head: Normocephalic and atraumatic.  Right Ear: Hearing normal.  Left Ear: Hearing normal.  Nose: Nose normal.  Mouth/Throat: Oropharynx is clear and moist and mucous membranes are normal.  Eyes: Conjunctivae and EOM are normal. Pupils are equal, round, and reactive to light.  Neck: Normal range of motion. Neck supple.  Cardiovascular: Regular rhythm, S1 normal and S2 normal.  Exam reveals no gallop and no friction rub.   No murmur heard. Pulmonary/Chest: Effort normal and breath sounds normal. No respiratory distress. He exhibits no tenderness.  Abdominal: Soft. Normal appearance and bowel sounds are normal. He exhibits distension. There is no hepatosplenomegaly. There is tenderness. There is no rebound, no guarding, no tenderness at McBurney's point and negative Murphy's sign. No hernia.  Mild distension, diffuse tenderness  Musculoskeletal: Normal range of motion.  Neurological: He is alert and oriented to person, place, and time. He has normal strength. No cranial nerve deficit or sensory deficit. Coordination normal. GCS eye subscore is 4. GCS verbal subscore is 5. GCS motor subscore is 6.  Skin: Skin is warm, dry and intact. No rash noted. No cyanosis.   Psychiatric: He has a normal mood and affect. His speech is normal and behavior is normal. Thought content normal.  Nursing note and vitals reviewed.   ED Course  Procedures  DIAGNOSTIC STUDIES: Oxygen Saturation is 98% on RA, normal by my interpretation.  COORDINATION OF CARE:  8:06 PM Discussed treatment plan with pt at bedside and pt agreed to plan.  Labs Review Labs Reviewed  CBC WITH DIFFERENTIAL/PLATELET - Abnormal; Notable for the following:    WBC 20.4 (*)    Neutrophils Relative % 88 (*)    Lymphocytes Relative 8 (*)    Neutro Abs 18.0 (*)    All other components within normal limits  COMPREHENSIVE METABOLIC PANEL - Abnormal; Notable for the following:    Glucose, Bld 274 (*)    AST 155 (*)    ALT 116 (*)  Total Bilirubin 2.1 (*)    All other components within normal limits  LIPASE, BLOOD - Abnormal; Notable for the following:    Lipase >3000 (*)    All other components within normal limits  URINALYSIS, ROUTINE W REFLEX MICROSCOPIC (NOT AT [facility]) - Abnormal; Notable for the following:    Color, Urine AMBER (*)    APPearance CLOUDY (*)    Glucose, UA 250 (*)    Hgb urine dipstick TRACE (*)    All other components within normal limits  APTT - Abnormal; Notable for the following:    aPTT 23 (*)    All other components within normal limits  BASIC METABOLIC PANEL - Abnormal; Notable for the following:    Glucose, Bld 318 (*)    Creatinine, Ser 1.27 (*)    Calcium 8.4 (*)    All other components within normal limits  CBC - Abnormal; Notable for the following:    WBC 17.3 (*)    All other components within normal limits  TROPONIN I - Abnormal; Notable for the following:    Troponin I 7.37 (*)    All other components within normal limits  TROPONIN I - Abnormal; Notable for the following:    Troponin I 58.64 (*)    All other components within normal limits  TROPONIN I - Abnormal; Notable for the following:    Troponin I >65.00 (*)    All other components  within normal limits  CK TOTAL AND CKMB (NOT AT [facility]) - Abnormal; Notable for the following:    Total CK 3005 (*)    CK, MB >260.0 (*)    All other components within normal limits  AMYLASE - Abnormal; Notable for the following:    Amylase 1225 (*)    All other components within normal limits  LIPASE, BLOOD - Abnormal; Notable for the following:    Lipase 936 (*)    All other components within normal limits  HEPATIC FUNCTION PANEL - Abnormal; Notable for the following:    AST 331 (*)    ALT 205 (*)    Total Bilirubin 1.8 (*)    Bilirubin, Direct 0.7 (*)    Indirect Bilirubin 1.1 (*)    All other components within normal limits  LACTATE DEHYDROGENASE - Abnormal; Notable for the following:    LDH 508 (*)    All other components within normal limits  LACTIC ACID, PLASMA - Abnormal; Notable for the following:    Lactic Acid, Venous 2.9 (*)    All other components within normal limits  GLUCOSE, CAPILLARY - Abnormal; Notable for the following:    Glucose-Capillary 359 (*)    All other components within normal limits  GLUCOSE, CAPILLARY - Abnormal; Notable for the following:    Glucose-Capillary 316 (*)    All other components within normal limits  GLUCOSE, CAPILLARY - Abnormal; Notable for the following:    Glucose-Capillary 303 (*)    All other components within normal limits  GLUCOSE, CAPILLARY - Abnormal; Notable for the following:    Glucose-Capillary 260 (*)    All other components within normal limits  CBG MONITORING, ED - Abnormal; Notable for the following:    Glucose-Capillary 245 (*)    All other components within normal limits  I-STAT CG4 LACTIC ACID, ED - Abnormal; Notable for the following:    Lactic Acid, Venous 2.18 (*)    All other components within normal limits  MRSA PCR SCREENING  TROPONIN I  PROTIME-INR  URINE MICROSCOPIC-ADD ON  MAGNESIUM  LIPID PANEL  TROPONIN I  TROPONIN I  HEMOGLOBIN A1C  HEPARIN LEVEL (UNFRACTIONATED)  HEPARIN LEVEL  (UNFRACTIONATED)  CBC  COMPREHENSIVE METABOLIC PANEL  LIPASE, BLOOD  TROPONIN I  POCT ACTIVATED CLOTTING TIME    Imaging Review Ct Abdomen Pelvis Wo Contrast  10/30/2014   CLINICAL DATA:  Patient with history of CHF and coronary artery disease presenting with sudden onset mid abdominal pain for 6 hours.  EXAM: CT CHEST, ABDOMEN AND PELVIS WITHOUT CONTRAST  TECHNIQUE: Multidetector CT imaging of the chest, abdomen and pelvis was performed following the standard protocol without IV contrast.  COMPARISON:  None.  FINDINGS: CT CHEST FINDINGS  Mediastinum/Nodes: Normal heart size. No pericardial effusion. Dense coronary arterial vascular calcifications. No axillary, mediastinal or hilar lymphadenopathy.  Lungs/Pleura: Central airways are patent. Depending ground-glass opacities within the bilateral lower lobes most compatible with atelectasis. No large pleural effusion. No pneumothorax.  CT ABDOMEN AND PELVIS FINDINGS  Hepatobiliary: The liver is normal in size and contour. Multiple high-density stones are demonstrated within the gallbladder lumen. No gallbladder wall thickening or pericholecystic fluid. No intrahepatic or extrahepatic biliary ductal dilatation.  Pancreas: There is peripancreatic fat stranding and thin fluid demonstrated. Fluid courses along the anterior right renal fascia. Unable to evaluate for pancreatic necrosis without IV contrast.  Spleen: Unremarkable  Adrenals/Urinary Tract: The adrenal glands are normal. There is mild bilateral perinephric fat stranding. Fullness within the renal pelvis bilaterally which is nonspecific however may represent multiple bilateral parapelvic cysts. The urinary bladder is unremarkable.  Stomach/Bowel: Normal appendix. No abnormal bowel wall thickening or evidence for bowel obstruction. Probable duodenum diverticulum.  Vascular/Lymphatic: Normal caliber abdominal aorta. No retroperitoneal lymphadenopathy.  Other: Prostate unremarkable. Bilateral fat  containing inguinal hernias, left-greater-than-right.  Musculoskeletal: No aggressive or acute appearing osseous lesions.  IMPRESSION: Fat stranding and fluid about the pancreas compatible with acute pancreatitis. Unable to assess for necrosis without IV contrast.  Cholelithiasis without CT evidence for acute cholecystitis.   Electronically Signed   By: Annia Belt M.D.   On: 10/27/2014 20:56   Ct Chest Wo Contrast  11/11/2014   CLINICAL DATA:  Patient with history of CHF and coronary artery disease presenting with sudden onset mid abdominal pain for 6 hours.  EXAM: CT CHEST, ABDOMEN AND PELVIS WITHOUT CONTRAST  TECHNIQUE: Multidetector CT imaging of the chest, abdomen and pelvis was performed following the standard protocol without IV contrast.  COMPARISON:  None.  FINDINGS: CT CHEST FINDINGS  Mediastinum/Nodes: Normal heart size. No pericardial effusion. Dense coronary arterial vascular calcifications. No axillary, mediastinal or hilar lymphadenopathy.  Lungs/Pleura: Central airways are patent. Depending ground-glass opacities within the bilateral lower lobes most compatible with atelectasis. No large pleural effusion. No pneumothorax.  CT ABDOMEN AND PELVIS FINDINGS  Hepatobiliary: The liver is normal in size and contour. Multiple high-density stones are demonstrated within the gallbladder lumen. No gallbladder wall thickening or pericholecystic fluid. No intrahepatic or extrahepatic biliary ductal dilatation.  Pancreas: There is peripancreatic fat stranding and thin fluid demonstrated. Fluid courses along the anterior right renal fascia. Unable to evaluate for pancreatic necrosis without IV contrast.  Spleen: Unremarkable  Adrenals/Urinary Tract: The adrenal glands are normal. There is mild bilateral perinephric fat stranding. Fullness within the renal pelvis bilaterally which is nonspecific however may represent multiple bilateral parapelvic cysts. The urinary bladder is unremarkable.  Stomach/Bowel: Normal  appendix. No abnormal bowel wall thickening or evidence for bowel obstruction. Probable duodenum diverticulum.  Vascular/Lymphatic: Normal caliber abdominal aorta. No retroperitoneal lymphadenopathy.  Other: Prostate unremarkable. Bilateral fat containing inguinal hernias, left-greater-than-right.  Musculoskeletal: No aggressive or acute appearing osseous lesions.  IMPRESSION: Fat stranding and fluid about the pancreas compatible with acute pancreatitis. Unable to assess for necrosis without IV contrast.  Cholelithiasis without CT evidence for acute cholecystitis.   Electronically Signed   By: Annia Belt M.D.   On: 11-18-2014 20:56   Dg Chest Port 1 View  11/04/2014   CLINICAL DATA:  Hypoxia and shortness of breath.  EXAM: PORTABLE CHEST - 1 VIEW  COMPARISON:  Chest CT 1 day prior.  FINDINGS: Lung volumes are low leading to accentuation of the cardiac size and crowding of bronchovascular structures. Probable bilateral perihilar atelectasis. No large pleural effusion or pneumothorax. Detailed evaluation limited by soft tissue attenuation from body habitus and low lung volumes.  IMPRESSION: Hypoventilatory chest with probable perihilar atelectasis.   Electronically Signed   By: Rubye Oaks M.D.   On: 11/04/2014 02:45   Dg Abd Portable 1v  11/04/2014   CLINICAL DATA:  Abdominal pain and distension, recent vomiting  EXAM: PORTABLE ABDOMEN - 1 VIEW  COMPARISON:  None.  FINDINGS: Stomach is distended with air. Scattered large and small bowel gas is noted. There is some suggestion of mildly dilated small bowel loops although incompletely evaluated on this supine study.  IMPRESSION: Stomach dilated with air.  There is some suggestion of mildly dilated small bowel loops.   Electronically Signed   By: Alcide Clever M.D.   On: 11/04/2014 12:13   US Abdomen Limited Ruq  11/04/2014   CLINICAL DATA:  Upper abdominal pain and pancreatitis  EXAM: US ABDOMEN LIMITED - RIGHT UPPER QUADRANT  COMPARISON:  11/18/2014   FINDINGS: Gallbladder:  Multiple gallstones are noted. Mild wall thickening at 3.9 mm is noted. Some of this may be reactive in nature given the known pancreatitis.  Common bile duct:  Diameter: 7 mm  Liver:  Diffuse increased echogenicity is noted likely related to fatty infiltration. A small focus of decreased echogenicity is noted adjacent to the gallbladder which may be a small amount of pericholecystic fluid or focal fatty sparing.  IMPRESSION: Cholelithiasis with mild gallbladder wall thickening.  Fatty liver.  Area of decreased attenuation adjacent to the gallbladder fossa. This may be related to an area of focal fatty sparing or small amount of pericholecystic fluid.   Electronically Signed   By: Alcide Clever M.D.   On: 11/04/2014 07:59   I have personally reviewed and evaluated these images and lab results as part of my medical decision-making.   EKG Interpretation   Date/Time:  Tuesday 2014/11/18 21:23:10 EDT Ventricular Rate:  87 PR Interval:  189 QRS Duration: 102 QT Interval:  373 QTC Calculation: 449 R Axis:   -36 Text Interpretation:  Sinus rhythm Left axis deviation Probable  anterolateral infarct, recent ED PHYSICIAN INTERPRETATION AVAILABLE IN  [facility] Confirmed by TEST, Record ([id number]) on 11/04/2014 8:01:47 AM      MDM   Final diagnoses:  Pancreatitis   Patient presents to the emergency department for evaluation of abdominal pain. Patient reports that symptoms began several hours before coming to the ER. Patient complaining of nausea with small amount of emesis. Pain is in the upper abdomen and radiates through into his back. He reports pain worsens when he lies down. He has not had any history of abdominal surgeries.  Examination arrival revealed that the patient appeared ill. He was pale and diaphoretic. Patient had abdominal pain workup initiated, but  he also has a history of coronary artery disease, EKG and troponin was ordered. EKG performed at arrival  showed ST elevation in lateral leads and anterior leads with reciprocal depressions.  Code STEMI was initiated. Case was discussed with Dr. Herbie Baltimore. He did review the EKG and recommended initiating code STEMI. I did, however, order CT chest, abdomen, pelvis to further evaluate for the abdominal pain and make sure he did not have any abdominal or thoracic aortic aneurysm or dissection. CT scan did not show any aortic abnormalities, but did show evidence of gallstone without cholecystitis and evidence of pancreatitis.  I did call Dr. Herbie Baltimore back and discussed this with him further. Based on this, it was felt that the patient might not be a candidate for immediate cardiac catheterization, patient will be evaluated by cardiology at arrival to [facility]. I also discussed the case with Dr Jodi Mourning, the ER physician. Patient will likely require hospital admission by medicine for treatment of pancreatitis and follow any recommendations from cardiology.  I personally performed the services described in this documentation, which was scribed in my presence. The recorded information has been reviewed and is accurate.   Justin Crease, MD 11/04/14 1740

## 2014-11-03 NOTE — Progress Notes (Signed)
ANTICOAGULATION CONSULT NOTE - Initial Consult  Pharmacy Consult for Heparin Indication: chest pain/ACS  No Known Allergies  Patient Measurements: Height:  (175.3 cm) Weight: 278 lb (126.1 kg) IBW/kg (Calculated) : 70.7 Heparin Dosing Weight: 99 kg  Vital Signs: Temp: 98 F (36.7 C) (08/23 1958) Temp Source: Oral (08/23 1958) BP: 141/77 mmHg (08/23 2029) Pulse Rate: 85 (08/23 2029)  Labs: No results for input(s): HGB, HCT, PLT, APTT, LABPROT, INR, HEPARINUNFRC, CREATININE, CKTOTAL, CKMB, TROPONINI in the last 72 hours.  CrCl cannot be calculated (Patient has no serum creatinine result on file.).   Medical History: Past Medical History  Diagnosis Date  . CHF 12/14/2009    EF 35% at the time of MI.  50% echo 6/12  . CAD (coronary artery disease)     November 14, 2009 anterior MI LAD 80% ostial, 70% proximal followed by total occlusion, ramus intermediate total occlusion circumflex 80%, PDA 90% lesions. Overlapping drug-eluting stents to the LAD  . Overweight(278.02)   . Diabetes mellitus   . HTN (hypertension)   . Hyperlipidemia     Medications:   (Not in a hospital admission) Scheduled:  . heparin       Infusions:  . sodium chloride    . sodium chloride      Assessment: 54yo male with history of CHF, CAD and HTN presents to [facility] with sudden onset abdominal pain. Pharmacy is consulted to dose heparin for ACS/chest pain. Labs are pending.  Goal of Therapy:  Heparin level 0.3-0.7 units/ml Monitor platelets by anticoagulation protocol: Yes   Plan:  Give 4000 units bolus x 1 Start heparin infusion at 1300 units/hr Check anti-Xa level in 6 hours and daily while on heparin Continue to monitor H&H and platelets  Arlean Hopping. Newman Pies, PharmD Clinical Pharmacist Pager [phone] 22-Nov-2014,8:39 PM

## 2014-11-03 NOTE — ED Notes (Signed)
LAC drawn by RN. Results of 2.18 hand delivered to Dr. Blinda Leatherwood.

## 2014-11-03 NOTE — ED Notes (Signed)
Patient transported to CT 

## 2014-11-03 NOTE — H&P (Addendum)
Patient ID: Justin [name] MRN: [id number], DOB/AGE: 20-Dec-1960   Admit date: 10/28/2014   Primary Physician: [name], MD Primary Cardiologist: Hochrein  Pt. Profile:  54M with DM, HTN, HLD, obesity and CAD s/p anterior wall MI (2011) with residual 3VD that was not amendeble to revascularization c/b LVSD (EF nadir 30-35%, improved to 45-50% in 2012) presents with lateral STEMI in the setting of pancreatitis.   Problem List  Past Medical History  Diagnosis Date  . CHF 12/14/2009    EF 35% at the time of MI.  50% echo 6/12  . CAD (coronary artery disease)     November 14, 2009 anterior MI LAD 80% ostial, 70% proximal followed by total occlusion, ramus intermediate total occlusion circumflex 80%, PDA 90% lesions. Overlapping drug-eluting stents to the LAD  . Overweight(278.02)   . Diabetes mellitus   . HTN (hypertension)   . Hyperlipidemia     History reviewed. No pertinent past surgical history.   Allergies  No Known Allergies  HPI 54M with DM, HTN, HLD, obesity and CAD s/p anterior wall MI (2011) with residual 3VD that was not amendeble to revascularization c/b LVSD (EF nadir 30-35%, improved to 45-50% in 2012) presents with lateral STEMI in the setting of pancreatitis.   Justin [name] was in his USOH and while at rest developed acute onset non-radiating abdominal pain that worsened to 10/10 severity over about an hour. No other associated symptoms. This was unlike any symptoms he had before, including with his MI (presentation was acute HF). He presented for evaluation at [facility] and was noted to have abdominal pain with tenderness to palpation and elevated LFTs (AST 155, ALT 115, bili 2.1). Labs otherwise notable for K 4.6, Cr 1.19, lactate 2.18, WBC 20.4, hct 47.5. An abdominal CT scan demonstrated fat stranding and fluid about the pancreas compatible with acute pancreatitis.  An ECG was checked and demonstrated 1-52mm STE in I and aVL with small Q wave  in aVL and reciprocal depressions in II, III, and aVF. A code STEMI was called. He did not have chest pain or dyspnea. He was transferred to [facility] for further management.   On arrival to [facility], he was evaluated in the ER by the Interventional Cardiology attending, Dr. Bryan Lemma, and me, given the discrepancy between the ECG and presenting symptoms in the setting of acute pancreatitis. This approach was taken instead of proceeding directly to the cath lab to allow for a repeat evaluation prior to proceeding with a cath.  He was hemodynamically stable. He continued to deny chest pain or dyspnea but ECG continued to demonstrate an injury current in the lateral leads with an evolving Q wave in aVL.   Home Medications  Prior to Admission medications   Medication Sig Start Date End Date Taking? Authorizing Provider  acetaminophen (TYLENOL) 325 MG tablet Take 650 mg by mouth every 6 (six) hours as needed.     Yes Historical Provider, MD  amLODipine (NORVASC) 5 MG tablet Take 5 mg by mouth daily.  05/19/13  Yes Historical Provider, MD  aspirin 325 MG tablet Take 325 mg by mouth daily.     Yes Historical Provider, MD  carvedilol (COREG) 12.5 MG tablet Take 12.5 mg by mouth 2 (two) times daily with a meal.   Yes Historical Provider, MD  enalapril (VASOTEC) 10 MG tablet Take 1 tablet (10 mg total) by mouth 2 (two) times daily. 06/27/10  Yes Rollene Rotunda, MD  furosemide (LASIX) 20 MG tablet  Take 20 mg by mouth daily.   Yes Historical Provider, MD  glimepiride (AMARYL) 4 MG tablet Take 4 mg by mouth daily.  05/19/13  Yes Historical Provider, MD  isosorbide mononitrate (IMDUR) 60 MG 24 hr tablet Take 60 mg by mouth daily.   Yes Historical Provider, MD  metFORMIN (GLUCOPHAGE) 1000 MG tablet Take 1,000 mg by mouth 2 (two) times daily with a meal.   Yes Historical Provider, MD  nitroGLYCERIN (NITROSTAT) 0.4 MG SL tablet Place 1 tablet (0.4 mg total) under the tongue every 5 (five) minutes as needed. 07/10/13  Yes Rollene Rotunda, MD  NON FORMULARY Take 1 tablet by mouth daily. Study Drug   Yes Historical Provider, MD  spironolactone (ALDACTONE) 25 MG tablet Take 12.5 mg by mouth daily.   Yes Historical Provider, MD  HYDROcodone-acetaminophen (NORCO) 5-325 MG per tablet Take 1-2 tablets by mouth every 4 (four) hours as needed. 11/03/13   Blane Ohara, MD  HYDROcodone-acetaminophen (NORCO/VICODIN) 5-325 MG per tablet Take 1 tablet by mouth every 4 (four) hours as needed. 11/18/13   Ivery Quale, PA-C  meloxicam (MOBIC) 15 MG tablet Take 1 tablet (15 mg total) by mouth daily. 11/18/13   Ivery Quale, PA-C  naproxen (NAPROSYN) 500 MG tablet Take 1 tablet (500 mg total) by mouth 2 (two) times daily. 11/03/13   Blane Ohara, MD    Family History  Family History  Problem Relation Age of Onset  . Coronary artery disease    . Heart attack    . Cardiomyopathy    . Heart disease      Social History  Social History   Social History  . Marital Status: Single    Spouse Name: N/A  . Number of Children: N/A  . Years of Education: N/A   Occupational History  . Not on file.   Social History Main Topics  . Smoking status: Never Smoker   . Smokeless tobacco: Never Used  . Alcohol Use: No  . Drug Use: Not on file  . Sexual Activity: Not on file   Other Topics Concern  . Not on file   Social History Narrative     Review of Systems General:  No chills, fever, night sweats or weight changes.  Cardiovascular:  No chest pain, dyspnea on exertion, edema, orthopnea, palpitations, paroxysmal nocturnal dyspnea. Dermatological: No rash, lesions/masses Respiratory: No cough, dyspnea Urologic: No hematuria, dysuria Abdominal:   No nausea, vomiting, diarrhea, bright red blood per rectum, melena, or hematemesis. + abdominal pain Neurologic:  No visual changes, wkns, changes in mental status. All other systems reviewed and are otherwise negative except as noted above.  Physical Exam  Blood pressure 134/90, pulse  91, temperature 98 F (36.7 C), temperature source Oral, resp. rate 19, height 5\' 9"  (1.753 m), weight 126.1 kg (278 lb), SpO2 94 %.  General: Pleasant, mild distress, lying flat comfortably Psych: Normal affect. Neuro: Alert and oriented X 3. Moves all extremities spontaneously. HEENT: Normal  Neck: Supple without bruits or JVD. Lungs:  Resp regular and unlabored, CTA. Heart: RRR no s3, s4, or murmurs. Abdomen: distended, TTP worst in epigastric area, without rebound or guarding. BS hypoactive.  Extremities: No clubbing, cyanosis or edema. DP/PT/Radials 2+ and equal bilaterally.  Labs  Troponin (Point of Care Test) No results for input(s): TROPIPOC in the last 72 hours.  Recent Labs  11-28-14 2020  TROPONINI <0.03   Lab Results  Component Value Date   WBC 20.4* 2014/11/28   HGB 16.4 11/28/14  HCT 47.5 11-13-14   MCV 90.3 11/13/14   PLT 263 Nov 13, 2014    Recent Labs Lab 13-Nov-2014 2020  NA 137  K 4.6  CL 103  CO2 24  BUN 18  CREATININE 1.19  CALCIUM 9.3  PROT 7.6  BILITOT 2.1*  ALKPHOS 65  ALT 116*  AST 155*  GLUCOSE 274*   Lab Results  Component Value Date   CHOL 117 02/21/2010   HDL 27.40* 02/21/2010   LDLCALC 67 02/21/2010   TRIG 113.0 02/21/2010   No results found for: DDIMER   Radiology/Studies  Ct Abdomen Pelvis Wo Contrast  11/13/2014   CLINICAL DATA:  Patient with history of CHF and coronary artery disease presenting with sudden onset mid abdominal pain for 6 hours.  EXAM: CT CHEST, ABDOMEN AND PELVIS WITHOUT CONTRAST  TECHNIQUE: Multidetector CT imaging of the chest, abdomen and pelvis was performed following the standard protocol without IV contrast.  COMPARISON:  None.  FINDINGS: CT CHEST FINDINGS  Mediastinum/Nodes: Normal heart size. No pericardial effusion. Dense coronary arterial vascular calcifications. No axillary, mediastinal or hilar lymphadenopathy.  Lungs/Pleura: Central airways are patent. Depending ground-glass opacities within  the bilateral lower lobes most compatible with atelectasis. No large pleural effusion. No pneumothorax.  CT ABDOMEN AND PELVIS FINDINGS  Hepatobiliary: The liver is normal in size and contour. Multiple high-density stones are demonstrated within the gallbladder lumen. No gallbladder wall thickening or pericholecystic fluid. No intrahepatic or extrahepatic biliary ductal dilatation.  Pancreas: There is peripancreatic fat stranding and thin fluid demonstrated. Fluid courses along the anterior right renal fascia. Unable to evaluate for pancreatic necrosis without IV contrast.  Spleen: Unremarkable  Adrenals/Urinary Tract: The adrenal glands are normal. There is mild bilateral perinephric fat stranding. Fullness within the renal pelvis bilaterally which is nonspecific however may represent multiple bilateral parapelvic cysts. The urinary bladder is unremarkable.  Stomach/Bowel: Normal appendix. No abnormal bowel wall thickening or evidence for bowel obstruction. Probable duodenum diverticulum.  Vascular/Lymphatic: Normal caliber abdominal aorta. No retroperitoneal lymphadenopathy.  Other: Prostate unremarkable. Bilateral fat containing inguinal hernias, left-greater-than-right.  Musculoskeletal: No aggressive or acute appearing osseous lesions.  IMPRESSION: Fat stranding and fluid about the pancreas compatible with acute pancreatitis. Unable to assess for necrosis without IV contrast.  Cholelithiasis without CT evidence for acute cholecystitis.   Electronically Signed   By: Annia Belt M.D.   On: 13-Nov-2014 20:56   Ct Chest Wo Contrast  11-13-2014   CLINICAL DATA:  Patient with history of CHF and coronary artery disease presenting with sudden onset mid abdominal pain for 6 hours.  EXAM: CT CHEST, ABDOMEN AND PELVIS WITHOUT CONTRAST  TECHNIQUE: Multidetector CT imaging of the chest, abdomen and pelvis was performed following the standard protocol without IV contrast.  COMPARISON:  None.  FINDINGS: CT CHEST FINDINGS   Mediastinum/Nodes: Normal heart size. No pericardial effusion. Dense coronary arterial vascular calcifications. No axillary, mediastinal or hilar lymphadenopathy.  Lungs/Pleura: Central airways are patent. Depending ground-glass opacities within the bilateral lower lobes most compatible with atelectasis. No large pleural effusion. No pneumothorax.  CT ABDOMEN AND PELVIS FINDINGS  Hepatobiliary: The liver is normal in size and contour. Multiple high-density stones are demonstrated within the gallbladder lumen. No gallbladder wall thickening or pericholecystic fluid. No intrahepatic or extrahepatic biliary ductal dilatation.  Pancreas: There is peripancreatic fat stranding and thin fluid demonstrated. Fluid courses along the anterior right renal fascia. Unable to evaluate for pancreatic necrosis without IV contrast.  Spleen: Unremarkable  Adrenals/Urinary Tract: The adrenal glands are normal.  There is mild bilateral perinephric fat stranding. Fullness within the renal pelvis bilaterally which is nonspecific however may represent multiple bilateral parapelvic cysts. The urinary bladder is unremarkable.  Stomach/Bowel: Normal appendix. No abnormal bowel wall thickening or evidence for bowel obstruction. Probable duodenum diverticulum.  Vascular/Lymphatic: Normal caliber abdominal aorta. No retroperitoneal lymphadenopathy.  Other: Prostate unremarkable. Bilateral fat containing inguinal hernias, left-greater-than-right.  Musculoskeletal: No aggressive or acute appearing osseous lesions.  IMPRESSION: Fat stranding and fluid about the pancreas compatible with acute pancreatitis. Unable to assess for necrosis without IV contrast.  Cholelithiasis without CT evidence for acute cholecystitis.   Electronically Signed   By: Annia Belt M.D.   On: 2014/11/20 20:56    09/06/10 TTE - Left ventricle: The cavity size was normal. Wall thickness was increased in a pattern of mild LVH. Systolic function was mildly reduced. The  estimated ejection fraction was in the range of 45% to 50%. Akinesis of the distal anterolateral and inferoseptal myocardium. Akinesis of the distal anterior and apical myocardium. Doppler parameters are consistent with abnormal left ventricular relaxation (grade 1 diastolic dysfunction). - Mitral valve: Mild regurgitation. - Left atrium: The atrium was moderately dilated. - Atrial septum: No defect or patent foramen ovale was identified.  ECG  11-20-2014 @21 :23: NSR 1-8mm STE in I and aVL with small Q wave in aVL. Reciprocal depressions in II, III, and aVF. LAD. Poor R wave progression c/w known prior anterior MI. 3mm STE in V2. Compared to prior on 07/10/13, STE and reciprocal changes are new.    ASSESSMENT AND PLAN  67M with DM, HTN, HLD, obesity and CAD s/p anterior wall MI (2011) with residual 3VD that was not amendeble to revascularization c/b LVSD (EF nadir 30-35%, improved to 45-50% in 2012) presents with lateral STEMI in the setting of pancreatitis. Although pancreatitis is a known STEMI mimic and an MI could simply be secondary to acute pancreatitis and risk of bleeding with angiography is likely higher in the setting of pancreatitis, we felt that the patient was still best suited with emergent angiography with PCI if necessary.  After a thorough evaluation and discussion with the patient, Dr. Herbie Baltimore recommended proceeding with emergent angiography +/- PCI. Will plan to consult medicine for assistance with management of acute biliary pancreatitis.    Neva Seat, MD 11-20-2014, 9:06 PM

## 2014-11-03 NOTE — ED Notes (Signed)
Pt reports mid abdominal pain, emesis x3, nausea since 1400.

## 2014-11-04 ENCOUNTER — Encounter (HOSPITAL_COMMUNITY): Payer: Self-pay | Admitting: Cardiology

## 2014-11-04 ENCOUNTER — Ambulatory Visit (HOSPITAL_COMMUNITY): Payer: BLUE CROSS/BLUE SHIELD

## 2014-11-04 ENCOUNTER — Inpatient Hospital Stay (HOSPITAL_COMMUNITY): Payer: BLUE CROSS/BLUE SHIELD

## 2014-11-04 DIAGNOSIS — I2510 Atherosclerotic heart disease of native coronary artery without angina pectoris: Secondary | ICD-10-CM

## 2014-11-04 DIAGNOSIS — E119 Type 2 diabetes mellitus without complications: Secondary | ICD-10-CM | POA: Diagnosis present

## 2014-11-04 DIAGNOSIS — I2129 ST elevation (STEMI) myocardial infarction involving other sites: Principal | ICD-10-CM

## 2014-11-04 DIAGNOSIS — I10 Essential (primary) hypertension: Secondary | ICD-10-CM | POA: Diagnosis present

## 2014-11-04 DIAGNOSIS — E1165 Type 2 diabetes mellitus with hyperglycemia: Secondary | ICD-10-CM

## 2014-11-04 DIAGNOSIS — K859 Acute pancreatitis, unspecified: Secondary | ICD-10-CM

## 2014-11-04 DIAGNOSIS — R0902 Hypoxemia: Secondary | ICD-10-CM | POA: Diagnosis present

## 2014-11-04 LAB — HEPATIC FUNCTION PANEL
ALK PHOS: 62 U/L (ref 38–126)
ALT: 205 U/L — ABNORMAL HIGH (ref 17–63)
AST: 331 U/L — ABNORMAL HIGH (ref 15–41)
Albumin: 3.8 g/dL (ref 3.5–5.0)
BILIRUBIN DIRECT: 0.7 mg/dL — AB (ref 0.1–0.5)
BILIRUBIN INDIRECT: 1.1 mg/dL — AB (ref 0.3–0.9)
BILIRUBIN TOTAL: 1.8 mg/dL — AB (ref 0.3–1.2)
TOTAL PROTEIN: 6.6 g/dL (ref 6.5–8.1)

## 2014-11-04 LAB — LIPID PANEL
Cholesterol: 139 mg/dL (ref 0–200)
HDL: 81 mg/dL (ref >40)
LDL CALC: 38 mg/dL (ref 0–99)
Total CHOL/HDL Ratio: 1.7 RATIO
Triglycerides: 99 mg/dL (ref <150)
VLDL: 20 mg/dL (ref 0–40)

## 2014-11-04 LAB — CBC
HEMATOCRIT: 48.9 % (ref 39.0–52.0)
HEMOGLOBIN: 17 g/dL (ref 13.0–17.0)
MCH: 31.5 pg (ref 26.0–34.0)
MCHC: 34.8 g/dL (ref 30.0–36.0)
MCV: 90.6 fL (ref 78.0–100.0)
Platelets: 227 10*3/uL (ref 150–400)
RBC: 5.4 MIL/uL (ref 4.22–5.81)
RDW: 13.1 % (ref 11.5–15.5)
WBC: 17.3 10*3/uL — ABNORMAL HIGH (ref 4.0–10.5)

## 2014-11-04 LAB — BASIC METABOLIC PANEL
ANION GAP: 11 (ref 5–15)
BUN: 18 mg/dL (ref 6–20)
CO2: 24 mmol/L (ref 22–32)
Calcium: 8.4 mg/dL — ABNORMAL LOW (ref 8.9–10.3)
Chloride: 102 mmol/L (ref 101–111)
Creatinine, Ser: 1.27 mg/dL — ABNORMAL HIGH (ref 0.61–1.24)
GFR calc Af Amer: >60 mL/min (ref >60)
GLUCOSE: 318 mg/dL — AB (ref 65–99)
POTASSIUM: 4.5 mmol/L (ref 3.5–5.1)
Sodium: 137 mmol/L (ref 135–145)

## 2014-11-04 LAB — POCT ACTIVATED CLOTTING TIME: Activated Clotting Time: 423 seconds

## 2014-11-04 LAB — GLUCOSE, CAPILLARY
GLUCOSE-CAPILLARY: 316 mg/dL — AB (ref 65–99)
GLUCOSE-CAPILLARY: 359 mg/dL — AB (ref 65–99)
GLUCOSE-CAPILLARY: 303 mg/dL — AB (ref 65–99)
GLUCOSE-CAPILLARY: 260 mg/dL — AB (ref 65–99)
Glucose-Capillary: 203 mg/dL — ABNORMAL HIGH (ref 65–99)

## 2014-11-04 LAB — URINALYSIS, ROUTINE W REFLEX MICROSCOPIC
Bilirubin Urine: NEGATIVE
GLUCOSE, UA: 250 mg/dL — AB
Ketones, ur: NEGATIVE mg/dL
LEUKOCYTES UA: NEGATIVE
Nitrite: NEGATIVE
PH: 5 (ref 5.0–8.0)
Protein, ur: NEGATIVE mg/dL
Specific Gravity, Urine: 1.005 (ref 1.005–1.030)
Urobilinogen, UA: 1 mg/dL (ref 0.0–1.0)

## 2014-11-04 LAB — MAGNESIUM: Magnesium: 1.8 mg/dL (ref 1.7–2.4)

## 2014-11-04 LAB — URINE MICROSCOPIC-ADD ON

## 2014-11-04 LAB — AMYLASE: Amylase: 1225 U/L — ABNORMAL HIGH (ref 28–100)

## 2014-11-04 LAB — TROPONIN I
TROPONIN I: 58.64 ng/mL — AB (ref <0.031)
Troponin I: 7.37 ng/mL (ref <0.031)
Troponin I: >65 ng/mL (ref <0.031)

## 2014-11-04 LAB — LACTIC ACID, PLASMA: Lactic Acid, Venous: 2.9 mmol/L (ref 0.5–2.0)

## 2014-11-04 LAB — CK TOTAL AND CKMB (NOT AT ARMC): Total CK: 3005 U/L — ABNORMAL HIGH (ref 49–397)

## 2014-11-04 LAB — HEPARIN LEVEL (UNFRACTIONATED): HEPARIN UNFRACTIONATED: 0.38 [IU]/mL (ref 0.30–0.70)

## 2014-11-04 LAB — LIPASE, BLOOD: Lipase: 936 U/L — ABNORMAL HIGH (ref 22–51)

## 2014-11-04 LAB — LACTATE DEHYDROGENASE: LDH: 508 U/L — AB (ref 98–192)

## 2014-11-04 LAB — MRSA PCR SCREENING: MRSA by PCR: NEGATIVE

## 2014-11-04 MED ORDER — HEPARIN (PORCINE) IN NACL 100-0.45 UNIT/ML-% IJ SOLN
1500.0000 [IU]/h | INTRAMUSCULAR | Status: DC
  Administered 2014-11-04: 1500 [IU]/h via INTRAVENOUS
  Filled 2014-11-04 (×2): qty 250

## 2014-11-04 MED ORDER — CETYLPYRIDINIUM CHLORIDE 0.05 % MT LIQD
7.0000 mL | Freq: Two times a day (BID) | OROMUCOSAL | Status: DC
  Administered 2014-11-04 – 2014-11-15 (×17): 7 mL via OROMUCOSAL

## 2014-11-04 MED ORDER — FUROSEMIDE 10 MG/ML IJ SOLN
40.0000 mg | Freq: Once | INTRAMUSCULAR | Status: AC
  Administered 2014-11-04: 40 mg via INTRAVENOUS
  Filled 2014-11-04: qty 4

## 2014-11-04 MED ORDER — HEPARIN (PORCINE) IN NACL 100-0.45 UNIT/ML-% IJ SOLN
1650.0000 [IU]/h | INTRAMUSCULAR | Status: AC
  Administered 2014-11-05 (×2): 1550 [IU]/h via INTRAVENOUS
  Filled 2014-11-04 (×2): qty 250

## 2014-11-04 MED ORDER — SIMETHICONE 40 MG/0.6ML PO SUSP
40.0000 mg | Freq: Three times a day (TID) | ORAL | Status: DC
  Administered 2014-11-04 – 2014-11-15 (×33): 40 mg via ORAL
  Filled 2014-11-04 (×48): qty 0.6

## 2014-11-04 MED ORDER — INSULIN GLARGINE 100 UNIT/ML ~~LOC~~ SOLN
7.0000 [IU] | Freq: Every day | SUBCUTANEOUS | Status: DC
  Filled 2014-11-04: qty 0.07

## 2014-11-04 MED ORDER — HYDROMORPHONE HCL 1 MG/ML IJ SOLN
1.0000 mg | INTRAMUSCULAR | Status: DC | PRN
  Administered 2014-11-04: 1 mg via INTRAVENOUS
  Filled 2014-11-04: qty 1

## 2014-11-04 MED ORDER — CARVEDILOL 25 MG PO TABS
25.0000 mg | ORAL_TABLET | Freq: Two times a day (BID) | ORAL | Status: DC
  Administered 2014-11-04 – 2014-11-11 (×14): 25 mg via ORAL
  Filled 2014-11-04 (×15): qty 1

## 2014-11-04 MED ORDER — HYDROMORPHONE HCL 1 MG/ML IJ SOLN
1.0000 mg | INTRAMUSCULAR | Status: DC | PRN
Start: 2014-11-04 — End: 2014-11-08
  Administered 2014-11-04 – 2014-11-06 (×5): 1 mg via INTRAVENOUS
  Filled 2014-11-04 (×5): qty 1

## 2014-11-04 MED ORDER — ASPIRIN EC 81 MG PO TBEC
162.0000 mg | DELAYED_RELEASE_TABLET | Freq: Every day | ORAL | Status: DC
  Administered 2014-11-04: 162 mg via ORAL
  Filled 2014-11-04: qty 2

## 2014-11-04 MED ORDER — SODIUM CHLORIDE 0.9 % IV SOLN
INTRAVENOUS | Status: DC
Start: 2014-11-04 — End: 2014-11-06
  Administered 2014-11-05 – 2014-11-06 (×3): via INTRAVENOUS

## 2014-11-04 MED ORDER — SODIUM CHLORIDE 0.9 % IV BOLUS (SEPSIS)
250.0000 mL | Freq: Once | INTRAVENOUS | Status: AC
  Administered 2014-11-04: 250 mL via INTRAVENOUS

## 2014-11-04 MED ORDER — INSULIN ASPART 100 UNIT/ML ~~LOC~~ SOLN
0.0000 [IU] | SUBCUTANEOUS | Status: DC
  Administered 2014-11-04: 15 [IU] via SUBCUTANEOUS
  Administered 2014-11-04: 4 [IU] via SUBCUTANEOUS
  Administered 2014-11-04: 15 [IU] via SUBCUTANEOUS
  Administered 2014-11-04: 11 [IU] via SUBCUTANEOUS
  Administered 2014-11-04: 20 [IU] via SUBCUTANEOUS
  Administered 2014-11-04: 4 [IU] via SUBCUTANEOUS
  Administered 2014-11-04: 7 [IU] via SUBCUTANEOUS
  Administered 2014-11-05 (×3): 4 [IU] via SUBCUTANEOUS
  Administered 2014-11-05: 7 [IU] via SUBCUTANEOUS
  Administered 2014-11-05 – 2014-11-07 (×7): 4 [IU] via SUBCUTANEOUS
  Administered 2014-11-07: 3 [IU] via SUBCUTANEOUS
  Administered 2014-11-07 (×2): 4 [IU] via SUBCUTANEOUS
  Administered 2014-11-07: 3 [IU] via SUBCUTANEOUS
  Administered 2014-11-08 (×2): 4 [IU] via SUBCUTANEOUS
  Administered 2014-11-08: 7 [IU] via SUBCUTANEOUS
  Administered 2014-11-08: 4 [IU] via SUBCUTANEOUS
  Administered 2014-11-08: 7 [IU] via SUBCUTANEOUS
  Administered 2014-11-08: 4 [IU] via SUBCUTANEOUS
  Administered 2014-11-08: 11 [IU] via SUBCUTANEOUS
  Administered 2014-11-09: 7 [IU] via SUBCUTANEOUS
  Administered 2014-11-09: 11 [IU] via SUBCUTANEOUS
  Administered 2014-11-09: 3 [IU] via SUBCUTANEOUS

## 2014-11-04 MED ORDER — NITROGLYCERIN IN D5W 200-5 MCG/ML-% IV SOLN
2.0000 ug/min | INTRAVENOUS | Status: DC
Start: 2014-11-04 — End: 2014-11-09
  Administered 2014-11-04: 5 ug/min via INTRAVENOUS
  Filled 2014-11-04: qty 250

## 2014-11-04 MED ORDER — LABETALOL HCL 5 MG/ML IV SOLN
10.0000 mg | INTRAVENOUS | Status: DC | PRN
  Administered 2014-11-04 (×2): 10 mg via INTRAVENOUS
  Filled 2014-11-04 (×2): qty 4

## 2014-11-04 MED ORDER — CARVEDILOL 12.5 MG PO TABS
12.5000 mg | ORAL_TABLET | Freq: Once | ORAL | Status: AC
  Administered 2014-11-04: 12.5 mg via ORAL

## 2014-11-04 MED ORDER — PANTOPRAZOLE SODIUM 40 MG IV SOLR
40.0000 mg | INTRAVENOUS | Status: DC
  Administered 2014-11-04 – 2014-11-09 (×6): 40 mg via INTRAVENOUS
  Filled 2014-11-04 (×6): qty 40

## 2014-11-04 MED ORDER — INSULIN GLARGINE 100 UNIT/ML ~~LOC~~ SOLN
10.0000 [IU] | Freq: Every day | SUBCUTANEOUS | Status: DC
  Administered 2014-11-04: 10 [IU] via SUBCUTANEOUS
  Filled 2014-11-04 (×2): qty 0.1

## 2014-11-04 MED ORDER — HEPARIN (PORCINE) IN NACL 100-0.45 UNIT/ML-% IJ SOLN: 1500.0000 [IU]/h | INTRAMUSCULAR | Status: DC

## 2014-11-04 MED ORDER — DOCUSATE SODIUM 50 MG PO CAPS
50.0000 mg | ORAL_CAPSULE | Freq: Two times a day (BID) | ORAL | Status: DC
  Administered 2014-11-04 – 2014-11-07 (×6): 50 mg via ORAL
  Filled 2014-11-04 (×9): qty 1

## 2014-11-04 MED ORDER — FENTANYL CITRATE (PF) 100 MCG/2ML IJ SOLN
25.0000 ug | INTRAMUSCULAR | Status: DC | PRN
  Administered 2014-11-04: 50 ug via INTRAVENOUS
  Administered 2014-11-04: 25 ug via INTRAVENOUS
  Administered 2014-11-04 (×3): 50 ug via INTRAVENOUS
  Filled 2014-11-04 (×6): qty 2

## 2014-11-04 MED ORDER — ATROPINE SULFATE 0.1 MG/ML IJ SOLN
INTRAMUSCULAR | Status: AC
  Filled 2014-11-04: qty 10

## 2014-11-04 MED ORDER — MORPHINE SULFATE (PF) 4 MG/ML IV SOLN
3.0000 mg | INTRAVENOUS | Status: DC | PRN
  Administered 2014-11-04 – 2014-11-05 (×6): 3 mg via INTRAVENOUS
  Filled 2014-11-04 (×6): qty 1

## 2014-11-04 MED ORDER — POLYETHYLENE GLYCOL 3350 17 G PO PACK
17.0000 g | PACK | Freq: Every day | ORAL | Status: DC
  Administered 2014-11-04 – 2014-11-11 (×7): 17 g via ORAL
  Filled 2014-11-04 (×8): qty 1

## 2014-11-04 MED ORDER — TICAGRELOR 90 MG PO TABS
90.0000 mg | ORAL_TABLET | Freq: Two times a day (BID) | ORAL | Status: DC
  Administered 2014-11-04 – 2014-11-15 (×24): 90 mg via ORAL
  Filled 2014-11-04 (×24): qty 1

## 2014-11-04 MED ORDER — METOPROLOL TARTRATE 1 MG/ML IV SOLN: 5.0000 mg | Freq: Four times a day (QID) | INTRAVENOUS | Status: DC | PRN

## 2014-11-04 NOTE — Progress Notes (Signed)
ANTICOAGULATION CONSULT NOTE - FOLLOW UP    HL = 0.38 (goal 0.3 - 0.7 units/mL) Heparin dosing weight = 99 kg   Assessment: 54 YOM presented with STEMI and was found to have a large amount of thrombus during cath.  Current plan is to continue IV heparin for 48 hours post cath.  Heparin level is therapeutic; no bleeding reported.  Aggrastat is off and Brillinta is ordered.   Plan: - Increase heparin gtt slightly to 1550 units/hr, stop on 8/26 at 0959 - F/U AM labs - F/U reducing ASA while on concomitant Brillinta     Ark Agrusa D. Laney Potash, PharmD, BCPS 11/04/2014, 7:30 PM

## 2014-11-04 NOTE — Progress Notes (Signed)
Spoke with Dr. Gwenlyn Perking about pt HR in 110s, RR 30s, T 99.2. No new orders at this time. Will continue to monitor closely.

## 2014-11-04 NOTE — Consult Note (Addendum)
PULMONARY / CRITICAL CARE MEDICINE   Name: Justin [name] MRN: [id number] DOB: 06-Oct-1960    ADMISSION DATE:  11-07-14 CONSULTATION DATE:  11/04/2014  REFERRING MD :  Dr. Antoine Poche  CHIEF COMPLAINT:  ABD pain  INITIAL PRESENTATION: 54 year old male presented to [facility] with abdominal pain. CT consistent with pancreatitis, however he had some lateral ST changed on ECG. He was transported to [facility] for STEMI workup, PCCM to see for medical management/pancreatitis.   STUDIES:  CT abd/chest > Fat stranding and fluid about the pancreas compatible with acute pancreatitis. Unable to assess for necrosis without IV contrast. Cholelithiasis without CT evidence for acute cholecystitis.  SIGNIFICANT EVENTS: 8/24 cardiac cath >>   HISTORY OF PRESENT ILLNESS:  54 year old male with PMH as below, which includes CHF ( LVEF 45-50%), CAD s/p PCI in 2011, HTN, and DM. 8/24 he developed acute onset 10/10 abdominal pain with associated nausea. At the time he reported that this pain was not similar to his previous MI. He presetned to [facility] with these complaints. Basic labs showed elevated LFTs. Abdomen was tender to palpation. CT scan was obtained and findings were consistent with acute pancreatitis. ECG was also performed in ED which demonstrated 1-10mm ST elevation in I and aVL and reciprocal depression in the inferior leads. He was transferred to [facility] for cardiology evalutaion. He was taken emergently to cath lab where he underwent coronary angioplasty to 100% LAD lesion. During the procedure he developed hypoxia and was placed on NRB. PCCM was consulted for assistance with pancreatitis.  The patient denies smoking, ETOH, and drug abuse. Currently his chest pain is resolved, however, abdominal pain is described as supraumbilical and 10/10 right now with associated nausea. He is not subjectively SOB, but does inspiration is limited by abdominal pain.   PAST MEDICAL  HISTORY :   has a past medical history of CHF (12/14/2009); CAD S/P percutaneous coronary angioplasty; ST elevation myocardial infarction (STEMI) of anterior wall (11/13/2009); Diabetes mellitus; HTN (hypertension); Hyperlipidemia; Diabetes mellitus; and Overweight.  has no past surgical history on file. Prior to Admission medications   Medication Sig Start Date End Date Taking? Authorizing Provider  acetaminophen (TYLENOL) 325 MG tablet Take 650 mg by mouth every 6 (six) hours as needed.     Yes Historical Provider, MD  amLODipine (NORVASC) 5 MG tablet Take 5 mg by mouth daily.  05/19/13  Yes Historical Provider, MD  aspirin 325 MG tablet Take 325 mg by mouth daily.     Yes Historical Provider, MD  carvedilol (COREG) 12.5 MG tablet Take 12.5 mg by mouth 2 (two) times daily with a meal.   Yes Historical Provider, MD  enalapril (VASOTEC) 10 MG tablet Take 1 tablet (10 mg total) by mouth 2 (two) times daily. 06/27/10  Yes Rollene Rotunda, MD  furosemide (LASIX) 20 MG tablet Take 20 mg by mouth daily.   Yes Historical Provider, MD  glimepiride (AMARYL) 4 MG tablet Take 4 mg by mouth daily.  05/19/13  Yes Historical Provider, MD  isosorbide mononitrate (IMDUR) 60 MG 24 hr tablet Take 60 mg by mouth daily.   Yes Historical Provider, MD  metFORMIN (GLUCOPHAGE) 1000 MG tablet Take 1,000 mg by mouth 2 (two) times daily with a meal.   Yes Historical Provider, MD  nitroGLYCERIN (NITROSTAT) 0.4 MG SL tablet Place 1 tablet (0.4 mg total) under the tongue every 5 (five) minutes as needed. 07/10/13  Yes Rollene Rotunda, MD  NON FORMULARY Take 1 tablet by mouth daily. Study Drug   Yes Historical Provider, MD  spironolactone (ALDACTONE) 25 MG tablet Take 12.5 mg by mouth daily.   Yes Historical Provider, MD  HYDROcodone-acetaminophen (NORCO) 5-325 MG per tablet Take 1-2 tablets by mouth every 4 (four) hours as needed. 11/03/13   Blane Ohara, MD  HYDROcodone-acetaminophen (NORCO/VICODIN) 5-325 MG per tablet Take 1 tablet by  mouth every 4 (four) hours as needed. 11/18/13   Ivery Quale, PA-C  meloxicam (MOBIC) 15 MG tablet Take 1 tablet (15 mg total) by mouth daily. 11/18/13   Ivery Quale, PA-C  naproxen (NAPROSYN) 500 MG tablet Take 1 tablet (500 mg total) by mouth 2 (two) times daily. 11/03/13   Blane Ohara, MD   No Known Allergies  FAMILY HISTORY:  has no family status information on file.  SOCIAL HISTORY:  reports that he has never smoked. He has never used smokeless tobacco. He reports that he does not drink alcohol.  REVIEW OF SYSTEMS:   Bolds are positive  Constitutional: weight loss, gain, night sweats, Fevers, chills, fatigue .  HEENT: headaches, Sore throat, sneezing, nasal congestion, post nasal drip, Difficulty swallowing, Tooth/dental problems, visual complaints visual changes, ear ache CV:  chest pain, radiates: ,Orthopnea, PND, swelling in lower extremities, dizziness, palpitations, syncope.  GI  heartburn, indigestion, abdominal pain, nausea, vomiting, diarrhea, change in bowel habits, loss of appetite, bloody stools.  Resp: cough, productive: , hemoptysis, dyspnea, chest pain, pleuritic.  Skin: rash or itching or icterus GU: dysuria, change in color of urine, urgency or frequency. flank pain, hematuria  MS: joint pain or swelling. decreased range of motion  Psych: change in mood or affect. depression or anxiety.  Neuro: difficulty with speech, weakness, numbness, ataxia    SUBJECTIVE:   VITAL SIGNS: Temp:  [98 F (36.7 C)] 98 F (36.7 C) (08/23 1958) Pulse Rate:  [0-288] 0 (08/23 2305) Resp:  [0-43] 0 (08/23 2305) BP: (121-147)/(77-101) 135/97 mmHg (08/23 2300) SpO2:  [0 %-99 %] 0 % (08/23 2305) Weight:  [126.1 kg (278 lb)] 126.1 kg (278 lb) (08/23 1955) HEMODYNAMICS:   VENTILATOR SETTINGS:   INTAKE / OUTPUT: No intake or output data in the 24 hours ending 11/04/14 0056  PHYSICAL EXAMINATION: General:  Obese male in mild resp distress on NRG Neuro:  Alert, oriented,  non-focal HEENT:  [place]/AT, No JVD, PERRL. "Fatty tissue" noted on right forehead. Cardiovascular:  RRR, no MRG Lungs:  Diminished Abdomen:  Soft, diffusely tender, non-distended, obese Musculoskeletal:  No acute deformity or ROM limitation Skin:  Grossly intact  LABS:  CBC  Recent Labs Lab 11/02/2014 2020  WBC 20.4*  HGB 16.4  HCT 47.5  PLT 263   Coag's  Recent Labs Lab 10/17/2014 2020  APTT 23*  INR 0.96   BMET  Recent Labs Lab 11/02/2014 2020  NA 137  K 4.6  CL 103  CO2 24  BUN 18  CREATININE 1.19  GLUCOSE 274*   Electrolytes  Recent Labs Lab 10/16/2014 2020  CALCIUM 9.3   Sepsis Markers  Recent Labs Lab 11/08/2014 2030  LATICACIDVEN 2.18*   ABG No results for input(s): PHART, PCO2ART, PO2ART in the last 168 hours. Liver Enzymes  Recent Labs Lab 10/18/2014 2020  AST 155*  ALT 116*  ALKPHOS 65  BILITOT 2.1*  ALBUMIN 4.3   Cardiac Enzymes  Recent Labs Lab 10/24/2014 2020  TROPONINI <0.03   Glucose  Recent Labs Lab 11/11/2014 2001  GLUCAP 245*   Lipase  Component Value Date/Time   LIPASE >3000* 11/08/2014 2020      Imaging Ct Abdomen Pelvis Wo Contrast  11-08-2014   CLINICAL DATA:  Patient with history of CHF and coronary artery disease presenting with sudden onset mid abdominal pain for 6 hours.  EXAM: CT CHEST, ABDOMEN AND PELVIS WITHOUT CONTRAST  TECHNIQUE: Multidetector CT imaging of the chest, abdomen and pelvis was performed following the standard protocol without IV contrast.  COMPARISON:  None.  FINDINGS: CT CHEST FINDINGS  Mediastinum/Nodes: Normal heart size. No pericardial effusion. Dense coronary arterial vascular calcifications. No axillary, mediastinal or hilar lymphadenopathy.  Lungs/Pleura: Central airways are patent. Depending ground-glass opacities within the bilateral lower lobes most compatible with atelectasis. No large pleural effusion. No pneumothorax.  CT ABDOMEN AND PELVIS FINDINGS  Hepatobiliary: The liver is normal  in size and contour. Multiple high-density stones are demonstrated within the gallbladder lumen. No gallbladder wall thickening or pericholecystic fluid. No intrahepatic or extrahepatic biliary ductal dilatation.  Pancreas: There is peripancreatic fat stranding and thin fluid demonstrated. Fluid courses along the anterior right renal fascia. Unable to evaluate for pancreatic necrosis without IV contrast.  Spleen: Unremarkable  Adrenals/Urinary Tract: The adrenal glands are normal. There is mild bilateral perinephric fat stranding. Fullness within the renal pelvis bilaterally which is nonspecific however may represent multiple bilateral parapelvic cysts. The urinary bladder is unremarkable.  Stomach/Bowel: Normal appendix. No abnormal bowel wall thickening or evidence for bowel obstruction. Probable duodenum diverticulum.  Vascular/Lymphatic: Normal caliber abdominal aorta. No retroperitoneal lymphadenopathy.  Other: Prostate unremarkable. Bilateral fat containing inguinal hernias, left-greater-than-right.  Musculoskeletal: No aggressive or acute appearing osseous lesions.  IMPRESSION: Fat stranding and fluid about the pancreas compatible with acute pancreatitis. Unable to assess for necrosis without IV contrast.  Cholelithiasis without CT evidence for acute cholecystitis.   Electronically Signed   By: Annia Belt M.D.   On: Nov 08, 2014 20:56   Ct Chest Wo Contrast  11/08/14   CLINICAL DATA:  Patient with history of CHF and coronary artery disease presenting with sudden onset mid abdominal pain for 6 hours.  EXAM: CT CHEST, ABDOMEN AND PELVIS WITHOUT CONTRAST  TECHNIQUE: Multidetector CT imaging of the chest, abdomen and pelvis was performed following the standard protocol without IV contrast.  COMPARISON:  None.  FINDINGS: CT CHEST FINDINGS  Mediastinum/Nodes: Normal heart size. No pericardial effusion. Dense coronary arterial vascular calcifications. No axillary, mediastinal or hilar lymphadenopathy.   Lungs/Pleura: Central airways are patent. Depending ground-glass opacities within the bilateral lower lobes most compatible with atelectasis. No large pleural effusion. No pneumothorax.  CT ABDOMEN AND PELVIS FINDINGS  Hepatobiliary: The liver is normal in size and contour. Multiple high-density stones are demonstrated within the gallbladder lumen. No gallbladder wall thickening or pericholecystic fluid. No intrahepatic or extrahepatic biliary ductal dilatation.  Pancreas: There is peripancreatic fat stranding and thin fluid demonstrated. Fluid courses along the anterior right renal fascia. Unable to evaluate for pancreatic necrosis without IV contrast.  Spleen: Unremarkable  Adrenals/Urinary Tract: The adrenal glands are normal. There is mild bilateral perinephric fat stranding. Fullness within the renal pelvis bilaterally which is nonspecific however may represent multiple bilateral parapelvic cysts. The urinary bladder is unremarkable.  Stomach/Bowel: Normal appendix. No abnormal bowel wall thickening or evidence for bowel obstruction. Probable duodenum diverticulum.  Vascular/Lymphatic: Normal caliber abdominal aorta. No retroperitoneal lymphadenopathy.  Other: Prostate unremarkable. Bilateral fat containing inguinal hernias, left-greater-than-right.  Musculoskeletal: No aggressive or acute appearing osseous lesions.  IMPRESSION: Fat stranding and fluid about the pancreas compatible with  acute pancreatitis. Unable to assess for necrosis without IV contrast.  Cholelithiasis without CT evidence for acute cholecystitis.   Electronically Signed   By: Annia Belt M.D.   On: November 18, 2014 20:56    ASSESSMENT / PLAN:  PULMONARY A: Acute hypoxemic respiratory failure  P:   Supplemental O2 to keep SpO2 > 95% Check CXR IS as tolerated  CARDIOVASCULAR A:  STEMI s/p balloon angioplasty to LAD Acute on chronic systolic CHF HTN H/o CAD  P:  Management per cardiolgoy  RENAL A:   No acute issues  P:    Follow Bmet frequently I&O  GASTROINTESTINAL A:   Acute pancreatitis, etiology uncertain. Ranson Score = 2 Transaminitis   P:   NPO Follow amylase, lipase, LFT Protonix for SUP Check lipid panel Consider abdominal US to assess for gall stone pancreatitis  IVF replacement limited by heart failure Pain, nausea management.  HEMATOLOGIC A:   No acute issues  P:  Follow CBC  INFECTIOUS A:   Acute pancreatitis  Leukocytosis  P:   BCx2 8/24 >>> No indication for ABX at this time. Follow WBC and fever curve  ENDOCRINE A:   DM    P:   CBG monitoring and SSI  NEUROLOGIC A:   No acute issues  P:   RASS goal: 0 Monitor   FAMILY  - Updates:   - Inter-disciplinary family meet or Palliative Care meeting due by:  8/24   Joneen Roach, AGACNP-BC [facility] Pager [phone] or [phone]  11/04/2014 2:16 AM  Attending Note:  54 year old male with CHF history who presents to the hospital with abdominal pain.  Noted to have a STEMI and complete LAD occlusion.  Taken to the cath lab.  Also noted to have pancreatitis and PCCM was consulted for medical management of pancreatitis.  Diffuse abdominal pain on exam with no peritoneal signs.  U/S I reviewed myself with evidence of gal bladder stones, likely cause of pancreatitis.  Discussed to TRH-MD and PCCM-NP.  STEMI:  - NTG drip as ordered.  - Beta blockers as ordered.  - Heparin drip.  - Cards primary.  DM:  - Maintain NPO for pancreatitis.  - CBGs as ordered.  - ISS.  Pancreatitis: likely due to gal bladder stones.  - NPO.  - No need for surgery's involvement at this time.  - Bowel rest.  - F/u amylase/lipase.  HTN:  - NTG drip.  - Will consider nitro patch or PO.  - Maintain on IV drips for now for BP control, NPO.  Hypoxemia: New to patient, no home O2.  - Titrate O2 for sat of 88-92%.  - If remains hypoxemic when closer to discharge then will consider an ambulatory  desaturation study for home O2.  - Diurese as able.  Discussed with TRH-MD, will sign off and have TRH manage medical issues.  PCCM will be available PRN.  Patient seen and examined, agree with above note.  I dictated the care and orders written for this patient under my direction.  Alyson Reedy, MD [phone]

## 2014-11-04 NOTE — Care Management Note (Addendum)
Case Management Note  Patient Details  Name: Justin [name] MRN: [id number] Date of Birth: 1961/02/22  Subjective/Objective:     Adm w mi               Action/Plan: lives w fam, pcp dr s Creta Levin   Expected Discharge Date:                  Expected Discharge Plan:  Home/Self Care  In-House Referral:     Discharge planning Services     Post Acute Care Choice:    Choice offered to:     DME Arranged:    DME Agency:     HH Arranged:    HH Agency:     Status of Service:     Medicare Important Message Given:    Date Medicare IM Given:    Medicare IM give by:    Date Additional Medicare IM Given:    Additional Medicare Important Message give by:     If discussed at Long Length of Stay Meetings, dates discussed:    Additional Comments: ur review done, gave pt 30day free brilinta card and copay assist card. Chart states bcbs ins.  Hanley Hays, RN 11/04/2014, 9:03 AM

## 2014-11-04 NOTE — Progress Notes (Signed)
PROGRESS NOTE  Subjective:   54 yo with hx of CHF ( LVEF 45-50%), CAD s/p PCI in 2011, HTN, and DM. Presented last night with an acute MI in the setting of pancreatitis  Still having abdominal pain  Had POBA of the prox and mid LAD. The distal LAD was very severely and diffusely disease and flow was NOT fully re-established to the apex   Objective:    Vital Signs:   Temp:  [97.7 F (36.5 C)-98.7 F (37.1 C)] 98.7 F (37.1 C) (08/24 0730) Pulse Rate:  [0-288] 109 (08/24 0800) Resp:  [0-43] 30 (08/24 0800) BP: (113-151)/(76-104) 150/85 mmHg (08/24 0800) SpO2:  [0 %-99 %] 93 % (08/24 0800) Arterial Line BP: (120-187)/(82-115) 120/82 mmHg (08/24 0155) Weight:  [126.1 kg (278 lb)-126.3 kg (278 lb 7.1 oz)] 126.3 kg (278 lb 7.1 oz) (08/23 2318)      24-hour weight change: Weight change:   Weight trends: Filed Weights   10/16/2014 1955 10/25/2014 2318  Weight: 126.1 kg (278 lb) 126.3 kg (278 lb 7.1 oz)    Intake/Output:  08/23 0701 - 08/24 0700 In: 335.5 [I.V.:335.5] Out: 1650 [Urine:1650] Total I/O In: 97.2 [P.O.:60; I.V.:37.2] Out: 625 [Urine:625]   Physical Exam: BP 150/85 mmHg  Pulse 109  Temp(Src) 98.7 F (37.1 C) (Oral)  Resp 30  Ht 5\' 9"  (1.753 m)  Wt 126.3 kg (278 lb 7.1 oz)  BMI 41.10 kg/m2  SpO2 93%  Wt Readings from Last 3 Encounters:  10/20/2014 126.3 kg (278 lb 7.1 oz)  11/18/13 120.203 kg (265 lb)  11/03/13 120.203 kg (265 lb)    General: Vital signs reviewed and noted. , moderate distress   Head: Normocephalic, atraumatic.  Eyes: conjunctivae/corneas clear.  EOM's intact.   Throat: normal  Neck:  thick  Lungs:    clear    Heart: RR , tach   Abdomen:  Obese, tense, reduced BS   Extremities: No edema    Neurologic: A&O X3, CN II - XII are grossly intact.   Psych: Normal     Labs: BMET:  Recent Labs  11/02/2014 2020 11/04/14 0002 11/04/14 0510  NA 137  --  137  K 4.6  --  4.5  CL 103  --  102  CO2 24  --  24  GLUCOSE 274*  --   318*  BUN 18  --  18  CREATININE 1.19  --  1.27*  CALCIUM 9.3  --  8.4*  MG  --  1.8  --     Liver function tests:  Recent Labs  10/18/2014 2020 11/04/14 0245  AST 155* 331*  ALT 116* 205*  ALKPHOS 65 62  BILITOT 2.1* 1.8*  PROT 7.6 6.6  ALBUMIN 4.3 3.8    Recent Labs  10/26/2014 2020 11/04/14 0245  LIPASE >3000* 936*  AMYLASE  --  1225*    CBC:  Recent Labs  11/09/2014 2020 11/04/14 0510  WBC 20.4* 17.3*  NEUTROABS 18.0*  --   HGB 16.4 17.0  HCT 47.5 48.9  MCV 90.3 90.6  PLT 263 227    Cardiac Enzymes:  Recent Labs  10/20/2014 2020 11/04/14 0002 11/04/14 0510  CKTOTAL  --   --  3005*  CKMB  --   --  >260.0*  TROPONINI <0.03 7.37* 58.64*    Coagulation Studies:  Recent Labs  10/20/2014 2020  LABPROT 13.0  INR 0.96    Other: Invalid input(s): POCBNP No results for input(s): DDIMER in the  last 72 hours. No results for input(s): HGBA1C in the last 72 hours.  Recent Labs  11/04/14 0510  CHOL 139  HDL 81  LDLCALC 38  TRIG 99  CHOLHDL 1.7   No results for input(s): TSH, T4TOTAL, T3FREE, THYROIDAB in the last 72 hours.  Invalid input(s): FREET3 No results for input(s): VITAMINB12, FOLATE, FERRITIN, TIBC, IRON, RETICCTPCT in the last 72 hours.   Other results:  tele  ( personally reviewed )  Sinus tach at 114  Medications:    Infusions: . sodium chloride 10 mL/hr at 11/04/14 0800  . heparin    . nitroGLYCERIN 15 mcg/min (11/04/14 0800)  . tirofiban 0.15 mcg/kg/min (11/04/14 0800)    Scheduled Medications: . antiseptic oral rinse  7 mL Mouth Rinse BID  . aspirin EC  162 mg Oral Daily  . carvedilol  12.5 mg Oral BID WC  . enalapril  10 mg Oral BID  . insulin aspart  0-20 Units Subcutaneous 6 times per day  . isosorbide mononitrate  60 mg Oral Daily  . sodium chloride  3 mL Intravenous Q12H  . spironolactone  12.5 mg Oral Daily    Assessment/ Plan:   Principal Problem:   ST elevation myocardial infarction (STEMI) of lateral  wall, initial episode of care Active Problems:   Hyperlipidemia with target LDL less than 70   CAD S/P percutaneous coronary angioplasty: DES x 2 to mid-dLAD, x 1 to ostial-prox LAD   Chronic combined systolic and diastolic congestive heart failure   Acute pancreatitis   Coronary stent thrombosis   1. CAD :  Had POBA to his prox and mid LAD.  Had lots of thrombus within the stents. Dr. Herbie Baltimore was not able to open the distal LAD - it was too diffusely diseased. Continue ASA, brilinta  Will increase coreg to 25 bid Continue enalapril, lasix, aldactone   2. Acute pancreatitis:  I've asked [facility] to help me with management of this  I appreciate any input that they can offer.     Disposition: will keep in CCU today   Length of Stay: 1  Vesta Mixer, Montez Hageman., MD, [facility] 11/04/2014, 9:45 AM Office [phone] Pager [phone]

## 2014-11-04 NOTE — Progress Notes (Signed)
RN's Ann Maki and Deniece Ree removed pt's R femoral sheath beginning at 0155 and ending at 0215 without complications. Vitals stable throughout pull, pulses palpable, level 1 pre-pull with bleeding at site, but level 0 post-pull. Atropine present but not used. Pressure dressing applied. Pt educated on coughing/sneezing pressure needed, keeping leg straight, and bed rest restrictions. Will continue to monitor pt and site.

## 2014-11-04 NOTE — Progress Notes (Addendum)
CARDIAC REHAB PHASE I  Pt lying in bed, HR 118 ST, 91 on 3L O2, RR in the high 30s. Pt does seem short of breath at rest, states he is tired. Held ambulation today, per pt RN, pt was up in chair earlier today and tolerated for a while. Pt agreeable to talk. Began MI/PCI education.  Reviewed risk factors, anti-platelet therapy, PCI, activity restrictions, ntg,  heart healthy diet, carb counting, portion control, CHF booklet and zone tool, daily weights, sodium and fluid restrictions, and phase 2 cardiac rehab. Pt verbalized understanding. Pt states he would like to think more about CRP2.  Pt states he does not thing he will be able to afford it if not completely covered by his insurance.  RN aware of pts' VS, states she will try to give him more pain medication. Will follow up tomorrow and ambulate pt if possile. Will need to review exercise guidelines as well. Pt in bed, call bell within reach.  [phone]  Joylene Grapes, RN, BSN 11/04/2014 3:01 PM

## 2014-11-04 NOTE — Consult Note (Addendum)
[facility] Medical Consultation  Justin [name] JXB:[id number] DOB: 06-01-1960 DOA: 10/27/2014 PCP: [name], MD   Requesting physician: Dr. Elease Hashimoto  Date of consultation: 11/04/14 Reason for consultation: Abd pain, Diabetes  Impression/Recommendations 1-ST elevation myocardial infarction (STEMI) of lateral wall: -s/p POBA to prox and mid LAD -further care per cardiology service -patient on ASA, COREG, ACE, Aldactone and brillinta   2-Hyperlipidemia with target LDL less than 70 -was not on statins at home -holding statin currently given transaminitis  3-Abd pain: due to pancreatitis -no stone obstruction seen on [place] -will continue supportive care -PRN antiemetics and analgesics -will add simethicone and some laxatives as he is complaining of obstipation/constipation -will check abd x-ray -continue NPO status except for sips with medications  -will check Lipase and CMET in am -AST mainly elevated due to MI  4-GI protection and NPO status: will add protonix  5-Chronic combined systolic and diastolic congestive heart failure -per cardiology service -appears to be compensated currently   6-Diabetes Mellitus type 2 with hyperglycemia:  -will continue holding oral hypoglycemic agents -continue resistant SSI and will add lantus QHS (will start with 10 units of lantus while NPO and adjust base on CBG's fluctuation)  7-Cholelithiasis: no signs of acute cholecystitis appreciated at this moment -will provide supportive care for pancreatitis -given acute MI no surgery recommended at this point -follow up in the future for elective intervention  8-cholecystitis: due to pancreatitis and stress demargination -no fever -will monitor trend -no need for antibiotics currently  9-obesity, Morbid: -Body mass index is 41.1 kg/(m^2). -low calorie diet and exercise discussed with patient   I will followup again tomorrow. Please contact me if I can be of assistance in the  meanwhile. Thank you for this consultation.  Chief Complaint: abd pain and was found to have STEMI  HPI:  63M with DM, HTN, HLD, obesity and CAD s/p anterior wall MI (2011) with residual 3VD that was not amendeble to revascularization c/b LVSD (EF nadir 30-35%, improved to 45-50% in 2012) presents with lateral STEMI. Patient also with abd pain and findings on CT abdomen and blood work suggesting pancreatitis. Patient is status post POBA to proximal and mid LAD. Given ongoing abd pain due to pancreatitis and in order to assist with rest of his medical conditions (especially diabetes) [facility] has been consulted.  Review of Systems:  Negative except as mentioned on HPI.   Past Medical History  Diagnosis Date  . CHF 12/14/2009    EF 35% at the time of MI.  50% echo 6/12  . CAD S/P percutaneous coronary angioplasty     November 14, 2009 anterior MI LAD 80% ostial, 70% proximal followed by total occlusion, ramus intermediate total occlusion circumflex 80%, PDA 90% lesions. Overlapping drug-eluting stents to the LAD  . ST elevation myocardial infarction (STEMI) of anterior wall 11/13/2009  . Diabetes mellitus   . HTN (hypertension)   . Hyperlipidemia   . Diabetes mellitus   . Overweight    Past Surgical History  Procedure Laterality Date  . Cardiac catheterization N/A 10/18/2014    Procedure: Left Heart Cath and Coronary Angiography;  Surgeon: Marykay Lex, MD;  Location: [facility] INVASIVE CV LAB;  Service: Cardiovascular;  Laterality: N/A;  . Cardiac catheterization N/A 10/14/2014    Procedure: Coronary Stent Intervention;  Surgeon: Marykay Lex, MD;  Location: [facility] INVASIVE CV LAB;  Service: Cardiovascular;  Laterality: N/A;   Social History:  reports that he has never smoked. He has never used smokeless tobacco. He  reports that he does not drink alcohol. His drug history is not on file.  No Known Allergies   Family History  Problem Relation Age of Onset  . Coronary artery disease    . Heart  attack    . Cardiomyopathy    . Heart disease      Prior to Admission medications   Medication Sig Start Date End Date Taking? Authorizing Provider  acetaminophen (TYLENOL) 325 MG tablet Take 650 mg by mouth every 6 (six) hours as needed.     Yes Historical Provider, MD  amLODipine (NORVASC) 5 MG tablet Take 5 mg by mouth daily.  05/19/13  Yes Historical Provider, MD  aspirin 325 MG tablet Take 325 mg by mouth daily.     Yes Historical Provider, MD  carvedilol (COREG) 12.5 MG tablet Take 12.5 mg by mouth 2 (two) times daily with a meal.   Yes Historical Provider, MD  enalapril (VASOTEC) 10 MG tablet Take 1 tablet (10 mg total) by mouth 2 (two) times daily. 06/27/10  Yes Rollene Rotunda, MD  furosemide (LASIX) 20 MG tablet Take 20 mg by mouth daily.   Yes Historical Provider, MD  glimepiride (AMARYL) 4 MG tablet Take 4 mg by mouth daily.  05/19/13  Yes Historical Provider, MD  isosorbide mononitrate (IMDUR) 60 MG 24 hr tablet Take 60 mg by mouth daily.   Yes Historical Provider, MD  metFORMIN (GLUCOPHAGE) 1000 MG tablet Take 1,000 mg by mouth 2 (two) times daily with a meal.   Yes Historical Provider, MD  nitroGLYCERIN (NITROSTAT) 0.4 MG SL tablet Place 1 tablet (0.4 mg total) under the tongue every 5 (five) minutes as needed. 07/10/13  Yes Rollene Rotunda, MD  NON FORMULARY Take 1 tablet by mouth daily. Study Drug   Yes Historical Provider, MD  spironolactone (ALDACTONE) 25 MG tablet Take 12.5 mg by mouth daily.   Yes Historical Provider, MD  HYDROcodone-acetaminophen (NORCO) 5-325 MG per tablet Take 1-2 tablets by mouth every 4 (four) hours as needed. 11/03/13   Blane Ohara, MD  HYDROcodone-acetaminophen (NORCO/VICODIN) 5-325 MG per tablet Take 1 tablet by mouth every 4 (four) hours as needed. 11/18/13   Ivery Quale, PA-C  meloxicam (MOBIC) 15 MG tablet Take 1 tablet (15 mg total) by mouth daily. 11/18/13   Ivery Quale, PA-C  naproxen (NAPROSYN) 500 MG tablet Take 1 tablet (500 mg total) by mouth 2  (two) times daily. 11/03/13   Blane Ohara, MD   Physical Exam: Blood pressure 121/78, pulse 113, temperature 98.7 F (37.1 C), temperature source Oral, resp. rate 33, height 5\' 9"  (1.753 m), weight 126.3 kg (278 lb 7.1 oz), SpO2 93 %. Filed Vitals:   11/04/14 1000  BP: 121/78  Pulse: 113  Temp:   Resp: 33     General:  AAOX3, reporting abd pain and some nausea; no CP currently and reports breathing is stable.  Eyes: PERRL, no icterus, no nystagmus  ENT: no erythema or exudates inside his mouth; [place] in place; no drainage out of ears or nostrils   Neck: no thyromegaly, no bruits, short thick neck   Cardiovascular: S1 and S2, no rubs or gallops, no murmurs   Respiratory: CTA bilaterally   Abdomen: decrease BS, distended, positive tenderness to palpation in epigastric region mainly   Skin: no open wounds, no petechiae, no rash  Musculoskeletal: no joint swelling appreciated, FROM  Psychiatric: stable mood, no hallucinations   Neurologic: no focal deficit appreciated  Labs on Admission:  Basic Metabolic Panel:  Recent Labs Lab 2014/11/15 2020 11/04/14 0002 11/04/14 0510  NA 137  --  137  K 4.6  --  4.5  CL 103  --  102  CO2 24  --  24  GLUCOSE 274*  --  318*  BUN 18  --  18  CREATININE 1.19  --  1.27*  CALCIUM 9.3  --  8.4*  MG  --  1.8  --    Liver Function Tests:  Recent Labs Lab 11/15/14 2020 11/04/14 0245  AST 155* 331*  ALT 116* 205*  ALKPHOS 65 62  BILITOT 2.1* 1.8*  PROT 7.6 6.6  ALBUMIN 4.3 3.8    Recent Labs Lab 11/15/14 2020 11/04/14 0245  LIPASE >3000* 936*  AMYLASE  --  1225*   CBC:  Recent Labs Lab 11/15/2014 2020 11/04/14 0510  WBC 20.4* 17.3*  NEUTROABS 18.0*  --   HGB 16.4 17.0  HCT 47.5 48.9  MCV 90.3 90.6  PLT 263 227   Cardiac Enzymes:  Recent Labs Lab 11/15/2014 2020 11/04/14 0002 11/04/14 0510  CKTOTAL  --   --  3005*  CKMB  --   --  >260.0*  TROPONINI <0.03 7.37* 58.64*   BNP: Invalid input(s):  POCBNP CBG:  Recent Labs Lab November 15, 2014 2001 11/04/14 0245 11/04/14 0346 11/04/14 0730  GLUCAP 245* 316* 359* 303*    Radiological Exams on Admission: Ct Abdomen Pelvis Wo Contrast  11-15-14   CLINICAL DATA:  Patient with history of CHF and coronary artery disease presenting with sudden onset mid abdominal pain for 6 hours.  EXAM: CT CHEST, ABDOMEN AND PELVIS WITHOUT CONTRAST  TECHNIQUE: Multidetector CT imaging of the chest, abdomen and pelvis was performed following the standard protocol without IV contrast.  COMPARISON:  None.  FINDINGS: CT CHEST FINDINGS  Mediastinum/Nodes: Normal heart size. No pericardial effusion. Dense coronary arterial vascular calcifications. No axillary, mediastinal or hilar lymphadenopathy.  Lungs/Pleura: Central airways are patent. Depending ground-glass opacities within the bilateral lower lobes most compatible with atelectasis. No large pleural effusion. No pneumothorax.  CT ABDOMEN AND PELVIS FINDINGS  Hepatobiliary: The liver is normal in size and contour. Multiple high-density stones are demonstrated within the gallbladder lumen. No gallbladder wall thickening or pericholecystic fluid. No intrahepatic or extrahepatic biliary ductal dilatation.  Pancreas: There is peripancreatic fat stranding and thin fluid demonstrated. Fluid courses along the anterior right renal fascia. Unable to evaluate for pancreatic necrosis without IV contrast.  Spleen: Unremarkable  Adrenals/Urinary Tract: The adrenal glands are normal. There is mild bilateral perinephric fat stranding. Fullness within the renal pelvis bilaterally which is nonspecific however may represent multiple bilateral parapelvic cysts. The urinary bladder is unremarkable.  Stomach/Bowel: Normal appendix. No abnormal bowel wall thickening or evidence for bowel obstruction. Probable duodenum diverticulum.  Vascular/Lymphatic: Normal caliber abdominal aorta. No retroperitoneal lymphadenopathy.  Other: Prostate  unremarkable. Bilateral fat containing inguinal hernias, left-greater-than-right.  Musculoskeletal: No aggressive or acute appearing osseous lesions.  IMPRESSION: Fat stranding and fluid about the pancreas compatible with acute pancreatitis. Unable to assess for necrosis without IV contrast.  Cholelithiasis without CT evidence for acute cholecystitis.   Electronically Signed   By: Annia Belt M.D.   On: 11/15/2014 20:56   Ct Chest Wo Contrast  2014/11/15   CLINICAL DATA:  Patient with history of CHF and coronary artery disease presenting with sudden onset mid abdominal pain for 6 hours.  EXAM: CT CHEST, ABDOMEN AND PELVIS WITHOUT CONTRAST  TECHNIQUE: Multidetector CT imaging of the chest, abdomen and pelvis was  performed following the standard protocol without IV contrast.  COMPARISON:  None.  FINDINGS: CT CHEST FINDINGS  Mediastinum/Nodes: Normal heart size. No pericardial effusion. Dense coronary arterial vascular calcifications. No axillary, mediastinal or hilar lymphadenopathy.  Lungs/Pleura: Central airways are patent. Depending ground-glass opacities within the bilateral lower lobes most compatible with atelectasis. No large pleural effusion. No pneumothorax.  CT ABDOMEN AND PELVIS FINDINGS  Hepatobiliary: The liver is normal in size and contour. Multiple high-density stones are demonstrated within the gallbladder lumen. No gallbladder wall thickening or pericholecystic fluid. No intrahepatic or extrahepatic biliary ductal dilatation.  Pancreas: There is peripancreatic fat stranding and thin fluid demonstrated. Fluid courses along the anterior right renal fascia. Unable to evaluate for pancreatic necrosis without IV contrast.  Spleen: Unremarkable  Adrenals/Urinary Tract: The adrenal glands are normal. There is mild bilateral perinephric fat stranding. Fullness within the renal pelvis bilaterally which is nonspecific however may represent multiple bilateral parapelvic cysts. The urinary bladder is  unremarkable.  Stomach/Bowel: Normal appendix. No abnormal bowel wall thickening or evidence for bowel obstruction. Probable duodenum diverticulum.  Vascular/Lymphatic: Normal caliber abdominal aorta. No retroperitoneal lymphadenopathy.  Other: Prostate unremarkable. Bilateral fat containing inguinal hernias, left-greater-than-right.  Musculoskeletal: No aggressive or acute appearing osseous lesions.  IMPRESSION: Fat stranding and fluid about the pancreas compatible with acute pancreatitis. Unable to assess for necrosis without IV contrast.  Cholelithiasis without CT evidence for acute cholecystitis.   Electronically Signed   By: Annia Belt M.D.   On: 11/04/2014 20:56   Dg Chest Port 1 View  11/04/2014   CLINICAL DATA:  Hypoxia and shortness of breath.  EXAM: PORTABLE CHEST - 1 VIEW  COMPARISON:  Chest CT 1 day prior.  FINDINGS: Lung volumes are low leading to accentuation of the cardiac size and crowding of bronchovascular structures. Probable bilateral perihilar atelectasis. No large pleural effusion or pneumothorax. Detailed evaluation limited by soft tissue attenuation from body habitus and low lung volumes.  IMPRESSION: Hypoventilatory chest with probable perihilar atelectasis.   Electronically Signed   By: Rubye Oaks M.D.   On: 11/04/2014 02:45   US Abdomen Limited Ruq  11/04/2014   CLINICAL DATA:  Upper abdominal pain and pancreatitis  EXAM: US ABDOMEN LIMITED - RIGHT UPPER QUADRANT  COMPARISON:  10/27/2014  FINDINGS: Gallbladder:  Multiple gallstones are noted. Mild wall thickening at 3.9 mm is noted. Some of this may be reactive in nature given the known pancreatitis.  Common bile duct:  Diameter: 7 mm  Liver:  Diffuse increased echogenicity is noted likely related to fatty infiltration. A small focus of decreased echogenicity is noted adjacent to the gallbladder which may be a small amount of pericholecystic fluid or focal fatty sparing.  IMPRESSION: Cholelithiasis with mild gallbladder wall  thickening.  Fatty liver.  Area of decreased attenuation adjacent to the gallbladder fossa. This may be related to an area of focal fatty sparing or small amount of pericholecystic fluid.   Electronically Signed   By: Alcide Clever M.D.   On: 11/04/2014 07:59   Time spent: 50 minutes  Vassie Loll [facility] Pager [phone]  If 7PM-7AM, please contact night-coverage [email or web address] Password [place] 11/04/2014, 10:32 AM

## 2014-11-04 NOTE — Progress Notes (Signed)
ANTICOAGULATION CONSULT NOTE Pharmacy Consult for Heparin Indication: chest pain/ACS  No Known Allergies  Patient Measurements: Height:  (175.3 cm) Weight: 278 lb 7.1 oz (126.3 kg) IBW/kg (Calculated) : 70.7 Heparin Dosing Weight: 99 kg  Vital Signs: Temp: 97.7 F (36.5 C) (08/23 2318) Temp Source: Oral (08/23 2318) BP: 135/97 mmHg (08/23 2300) Pulse Rate: 105 (08/24 0100)  Labs:  Recent Labs  November 30, 2014 2020 11/04/14 0002  HGB 16.4  --   HCT 47.5  --   PLT 263  --   APTT 23*  --   LABPROT 13.0  --   INR 0.96  --   CREATININE 1.19  --   TROPONINI <0.03 7.37*    Estimated Creatinine Clearance: 92.2 mL/min (by C-G formula based on Cr of 1.19).  Assessment: 54 yo male with STEMI s/p PTCA for heparin.    Goal of Therapy:  Heparin level 0.3-0.5 while on Aggrastat Monitor platelets by anticoagulation protocol: Yes   Plan:  Start heparin 1500 units/hr at 1000 Check heparin level in 8 hours.  Geannie Risen, PharmD, BCPS  11/04/2014,1:38 AM

## 2014-11-04 NOTE — Progress Notes (Signed)
Inpatient Diabetes Program Recommendations  AACE/ADA: New Consensus Statement on Inpatient Glycemic Control (2013)  Target Ranges:  Prepandial:   less than 140 mg/dL      Peak postprandial:   less than 180 mg/dL (1-2 hours)      Critically ill patients:  140 - 180 mg/dL  Results for GLENDEL, JAGGERS (MRN [id number]) as of 11/04/2014 09:58  Ref. Range 2014/11/07 20:01 11/04/2014 02:45 11/04/2014 03:46 11/04/2014 07:30  Glucose-Capillary Latest Ref Range: 65-99 mg/dL 638 (H) 756 (H) 433 (H) 303 (H)   Inpatient Diabetes Program Recommendations Insulin - Basal: add Lantus 25 units daily Thank you  Piedad Climes BSN, RN,CDE Inpatient Diabetes Coordinator [phone] (team pager)

## 2014-11-04 NOTE — Progress Notes (Addendum)
Spoke with Dr. Elease Hashimoto about Pt HR continually in 100s Respirations in the 30s. New orders received. Will continue to monitor closely.

## 2014-11-04 NOTE — Progress Notes (Signed)
CRITICAL VALUE ALERT  Critical value received:  Troponin 58.64  Date of notification:  N/A - lab did not call   Time of notification:  N/A - lab did not call  Nurse who received alert:  Ann Maki   MD notified (1st page):  Barrett  Time of first page:  [phone]  Responding MD:  Barrett  Time MD responded:  [phone]

## 2014-11-04 NOTE — Progress Notes (Signed)
CRITICAL VALUE ALERT  Critical value received:  Lactic acid 2.9  Date of notification:  11/04/14  Time of notification:  0615  Critical value read back:Yes.    Nurse who received alert:  Ann Maki, RN  MD notified (1st page):  Barrett  Time of first page:  [phone]  Responding MD:  Barrett  Time MD responded:  [phone]  Also paged MD that pt has had 4-5 runs of vtach lasting 5-15 beats, that pt comes out of on own, is asymptomatic. Will continue to monitor.

## 2014-11-04 NOTE — Progress Notes (Signed)
Called pharmacy for advise on pain medication in this patient with acute pancreatitis and STEMI with LFT trending up, advised can try dilaudid to see if helps.   Ramond Dial PA Pager: [phone]

## 2014-11-05 DIAGNOSIS — R140 Abdominal distension (gaseous): Secondary | ICD-10-CM | POA: Diagnosis present

## 2014-11-05 DIAGNOSIS — E119 Type 2 diabetes mellitus without complications: Secondary | ICD-10-CM | POA: Diagnosis present

## 2014-11-05 LAB — GLUCOSE, CAPILLARY
GLUCOSE-CAPILLARY: 166 mg/dL — AB (ref 65–99)
GLUCOSE-CAPILLARY: 182 mg/dL — AB (ref 65–99)
GLUCOSE-CAPILLARY: 160 mg/dL — AB (ref 65–99)
GLUCOSE-CAPILLARY: 181 mg/dL — AB (ref 65–99)
Glucose-Capillary: 171 mg/dL — ABNORMAL HIGH (ref 65–99)
Glucose-Capillary: 192 mg/dL — ABNORMAL HIGH (ref 65–99)
Glucose-Capillary: 207 mg/dL — ABNORMAL HIGH (ref 65–99)

## 2014-11-05 LAB — TROPONIN I
TROPONIN I: 59.66 ng/mL — AB (ref <0.031)
Troponin I: 38.04 ng/mL (ref <0.031)
Troponin I: 45.96 ng/mL (ref <0.031)
Troponin I: 55.08 ng/mL (ref <0.031)

## 2014-11-05 LAB — COMPREHENSIVE METABOLIC PANEL
ALT: 250 U/L — AB (ref 17–63)
AST: 236 U/L — ABNORMAL HIGH (ref 15–41)
Albumin: 2.9 g/dL — ABNORMAL LOW (ref 3.5–5.0)
Alkaline Phosphatase: 52 U/L (ref 38–126)
Anion gap: 11 (ref 5–15)
BILIRUBIN TOTAL: 3.9 mg/dL — AB (ref 0.3–1.2)
BUN: 42 mg/dL — AB (ref 6–20)
CALCIUM: 7.1 mg/dL — AB (ref 8.9–10.3)
CHLORIDE: 99 mmol/L — AB (ref 101–111)
CO2: 21 mmol/L — ABNORMAL LOW (ref 22–32)
CREATININE: 3.12 mg/dL — AB (ref 0.61–1.24)
GFR, EST AFRICAN AMERICAN: 24 mL/min — AB (ref >60)
GFR, EST NON AFRICAN AMERICAN: 21 mL/min — AB (ref >60)
Glucose, Bld: 214 mg/dL — ABNORMAL HIGH (ref 65–99)
Potassium: 4.4 mmol/L (ref 3.5–5.1)
Sodium: 131 mmol/L — ABNORMAL LOW (ref 135–145)
TOTAL PROTEIN: 6.1 g/dL — AB (ref 6.5–8.1)

## 2014-11-05 LAB — CBC
HEMATOCRIT: 44.1 % (ref 39.0–52.0)
Hemoglobin: 15.3 g/dL (ref 13.0–17.0)
MCH: 31.4 pg (ref 26.0–34.0)
MCHC: 34.7 g/dL (ref 30.0–36.0)
MCV: 90.4 fL (ref 78.0–100.0)
PLATELETS: 151 10*3/uL (ref 150–400)
RBC: 4.88 MIL/uL (ref 4.22–5.81)
RDW: 13.4 % (ref 11.5–15.5)
WBC: 15 10*3/uL — ABNORMAL HIGH (ref 4.0–10.5)

## 2014-11-05 LAB — HEMOGLOBIN A1C
Hgb A1c MFr Bld: 6.4 % — ABNORMAL HIGH (ref 4.8–5.6)
Mean Plasma Glucose: 137 mg/dL

## 2014-11-05 LAB — HEPARIN LEVEL (UNFRACTIONATED): Heparin Unfractionated: 0.45 IU/mL (ref 0.30–0.70)

## 2014-11-05 LAB — LIPASE, BLOOD: LIPASE: 702 U/L — AB (ref 22–51)

## 2014-11-05 MED ORDER — GUAIFENESIN-DM 100-10 MG/5ML PO SYRP
15.0000 mL | ORAL_SOLUTION | ORAL | Status: DC | PRN
  Administered 2014-11-05 – 2014-11-06 (×4): 15 mL via ORAL
  Filled 2014-11-05 (×4): qty 15

## 2014-11-05 MED ORDER — INSULIN GLARGINE 100 UNIT/ML ~~LOC~~ SOLN
15.0000 [IU] | Freq: Every day | SUBCUTANEOUS | Status: DC
  Administered 2014-11-05 – 2014-11-08 (×4): 15 [IU] via SUBCUTANEOUS
  Filled 2014-11-05 (×6): qty 0.15

## 2014-11-05 MED ORDER — ASPIRIN 81 MG PO CHEW
81.0000 mg | CHEWABLE_TABLET | Freq: Every day | ORAL | Status: DC
  Administered 2014-11-05 – 2014-11-15 (×11): 81 mg via ORAL
  Filled 2014-11-05 (×11): qty 1

## 2014-11-05 MED ORDER — DEXTROSE 50 % IV SOLN
INTRAVENOUS | Status: AC
  Filled 2014-11-05: qty 50

## 2014-11-05 NOTE — Progress Notes (Signed)
CARDIAC REHAB PHASE I   PRE:  Rate/Rhythm: 109 ST  BP:  Lying:  98/73 Sitting: 98/65        SaO2: 91 6L  MODE:  Ambulation: 350 ft   POST:  Rate/Rhythm: 115-122 ST  BP:  Sitting: 144/71         SaO2: 94 6L  Pt lying in bed, weak, still c/o abdominal pain 8/10. O2 sats 90-92 on 6L nasal cannula. Pt agreeable to ambulate.  Pt stood with minimal assistance. Pt ambulated 350 ft on 6L O2, rolling walker, IV, assist x2, fairly steady gait, tolerated fair. Pt c/o DOE, denies CP, dizziness, declined rest stop. Pt did c/o abdominal pain and fatigue upon returning to room, visibly short of breath. Pt HR 115-122 ST during ambulation, sats 89-92% on 6L. Pt to recliner after walk, call bell within reach, family at bedside. Will follow.     [id number]  Joylene Grapes, RN, BSN 11/05/2014 1:53 PM

## 2014-11-05 NOTE — Progress Notes (Signed)
*  PRELIMINARY RESULTS* Echocardiogram 2D Echocardiogram has been performed.  Justin [name] 11/05/2014, 10:44 AM 

## 2014-11-05 NOTE — Progress Notes (Signed)
Asked by pharmacy to verify ASA, not sure why the pt is on  ASA daily, however given the need for Brilinta which require pt on  ASA, will change the dose to  ASA instead.  Ramond Dial PA Pager: [phone]

## 2014-11-05 NOTE — Consult Note (Signed)
[facility] Medical Consultation  Justin [name] ZOX:[id number] DOB: [birth year] DOA: 11/06/2014 PCP: [name], MD   Requesting physician: Dr. Elease Hashimoto  Date of consultation: 11/04/14 Reason for consultation: Abd pain, Diabetes    Impression/Recommendations Principal Problem:   ST elevation myocardial infarction (STEMI) of lateral wall, initial episode of care Active Problems:   Hyperlipidemia with target LDL less than 70   CAD S/P percutaneous coronary angioplasty: DES x 2 to mid-dLAD, x 1 to ostial-prox LAD   Chronic combined systolic and diastolic congestive heart failure   Acute pancreatitis   Coronary stent thrombosis   Essential hypertension   Type 2 diabetes mellitus without complication   Hypoxia   Abdominal distension   Diabetes type 2, controlled    ST elevation myocardial infarction (STEMI) of lateral wall: -s/p POBA to prox and mid LAD -further care per cardiology service -patient on ASA, COREG, ACE, Aldactone and brillinta   Chronic combined systolic and diastolic congestive heart failure -per cardiology service -appears to be compensated currently   HLD -target LDL< 70 -was not on statins at home -holding statin currently given transaminitis  Abdominal pain  -Secondary to pancreatitis; lipase trending down but still significantly elevated -Abdominal ultrasound; no stone  -PRN antiemetics and analgesics -will add simethicone and some laxatives as he is complaining of obstipation/constipation -continue NPO status except for sips with medications  -AST mainly elevated due to MI  Diabetes Mellitus type 2 controlled  -8/24 hemoglobin A1c = 6.4   -Increase Lantus to 15 units daily -Continue resistant SSI  Cholelithiasis:  - no signs of acute cholecystitis appreciated at this moment -will provide supportive care for pancreatitis -given acute MI no surgery recommended at this point -follow up in the future for elective  intervention  Cholecystitis:  -due to pancreatitis and stress demargination -no fever -will monitor trend -no need for antibiotics currently       I will followup again tomorrow. Please contact me if I can be of assistance in the meanwhile. Thank you for this consultation.  Chief Complaint: Multiple medical problems  HPI:  21 WM PMHx DM Type 2, HTN, systolic CHF, CAD s/p anterior wall MI (2011) with residual 3VD that was not amendeble to revascularization c/b LVSD (EF nadir 30-35%, improved to 45-50% in 2012) HLD, obesity.  Presents with lateral STEMI in the setting of pancreatitis.   Review of symptoms The patient denies anorexia, fever, weight loss,, vision loss, decreased hearing, hoarseness, chest pain, syncope, dyspnea on exertion, peripheral edema, balance deficits, hemoptysis, abdominal pain, melena, hematochezia, severe indigestion/heartburn, hematuria, incontinence, genital sores, muscle weakness, suspicious skin lesions, transient blindness, difficulty walking, depression, unusual weight change, abnormal bleeding, enlarged lymph nodes, angioedema, and breast masses.   Consultants:   Procedure/Significant Events:    Culture    Antibiotics:   DVT prophylaxis:    Past Medical History  Diagnosis Date  . CHF 12/14/2009    EF 35% at the time of MI.  50% echo 6/12  . CAD S/P percutaneous coronary angioplasty     November 14, 2009 anterior MI LAD 80% ostial, 70% proximal followed by total occlusion, ramus intermediate total occlusion circumflex 80%, PDA 90% lesions. Overlapping drug-eluting stents to the LAD  . ST elevation myocardial infarction (STEMI) of anterior wall 11/13/2009  . Diabetes mellitus   . HTN (hypertension)   . Hyperlipidemia   . Diabetes mellitus   . Overweight    Past Surgical History  Procedure Laterality Date  . Cardiac catheterization N/A 11/02/2014  Procedure: Left Heart Cath and Coronary Angiography;  Surgeon: Marykay Lex, MD;   Location: [facility] INVASIVE CV LAB;  Service: Cardiovascular;  Laterality: N/A;  . Cardiac catheterization N/A 2014-11-04    Procedure: Coronary Stent Intervention;  Surgeon: Marykay Lex, MD;  Location: [facility] INVASIVE CV LAB;  Service: Cardiovascular;  Laterality: N/A;   Social History:  reports that he has never smoked. He has never used smokeless tobacco. He reports that he does not drink alcohol. His drug history is not on file.  No Known Allergies Family History  Problem Relation Age of Onset  . Coronary artery disease    . Heart attack    . Cardiomyopathy    . Heart disease      Prior to Admission medications   Medication Sig Start Date End Date Taking? Authorizing Provider  acetaminophen (TYLENOL) 325 MG tablet Take 650 mg by mouth every 6 (six) hours as needed.     Yes Historical Provider, MD  amLODipine (NORVASC) 5 MG tablet Take 5 mg by mouth daily.  05/19/13  Yes Historical Provider, MD  aspirin 325 MG tablet Take 325 mg by mouth daily.     Yes Historical Provider, MD  carvedilol (COREG) 12.5 MG tablet Take 12.5 mg by mouth 2 (two) times daily with a meal.   Yes Historical Provider, MD  enalapril (VASOTEC) 10 MG tablet Take 1 tablet (10 mg total) by mouth 2 (two) times daily. 06/27/10  Yes Rollene Rotunda, MD  furosemide (LASIX) 20 MG tablet Take 20 mg by mouth daily.   Yes Historical Provider, MD  glimepiride (AMARYL) 4 MG tablet Take 4 mg by mouth daily.  05/19/13  Yes Historical Provider, MD  isosorbide mononitrate (IMDUR) 60 MG 24 hr tablet Take 60 mg by mouth daily.   Yes Historical Provider, MD  metFORMIN (GLUCOPHAGE) 1000 MG tablet Take 1,000 mg by mouth 2 (two) times daily with a meal.   Yes Historical Provider, MD  nitroGLYCERIN (NITROSTAT) 0.4 MG SL tablet Place 1 tablet (0.4 mg total) under the tongue every 5 (five) minutes as needed. 07/10/13  Yes Rollene Rotunda, MD  NON FORMULARY Take 1 tablet by mouth daily. Study Drug   Yes Historical Provider, MD  spironolactone (ALDACTONE)  25 MG tablet Take 12.5 mg by mouth daily.   Yes Historical Provider, MD  HYDROcodone-acetaminophen (NORCO) 5-325 MG per tablet Take 1-2 tablets by mouth every 4 (four) hours as needed. 11/03/13   Blane Ohara, MD  HYDROcodone-acetaminophen (NORCO/VICODIN) 5-325 MG per tablet Take 1 tablet by mouth every 4 (four) hours as needed. 11/18/13   Ivery Quale, PA-C  meloxicam (MOBIC) 15 MG tablet Take 1 tablet (15 mg total) by mouth daily. 11/18/13   Ivery Quale, PA-C  naproxen (NAPROSYN) 500 MG tablet Take 1 tablet (500 mg total) by mouth 2 (two) times daily. 11/03/13   Blane Ohara, MD   Physical Exam: Blood pressure 106/69, pulse 104, temperature 97.6 F (36.4 C), temperature source Oral, resp. rate 30, height 5\' 9"  (1.753 m), weight 126.3 kg (278 lb 7.1 oz), SpO2 93 %. Filed Vitals:   11/05/14 1700 11/05/14 1800 11/05/14 1805 11/05/14 1900  BP: 80/52 88/50 102/60 106/69  Pulse: 104 103 104 104  Temp:      TempSrc:      Resp: 30 29 28 30   Height:      Weight:      SpO2: 95%  90% 93%      General: A/OX4, mild abdominal pain, negative N/V,  negative CP. Sitting in chair comfortably  Eyes: PERRL, no icterus, no nystagmus  ENT: no erythema or exudates inside his mouth; [place] in place; no drainage out of ears or nostrils   Neck: no thyromegaly, no bruits, short thick neck   Cardiovascular: S1 and S2, no rubs or gallops, no murmurs   Respiratory: CTA bilaterally   Abdomen: decrease BS, distended, positive tenderness to palpation in epigastric region mainly   Skin: no open wounds, no petechiae, no rash  Musculoskeletal: no joint swelling appreciated, FROM  Psychiatric: stable mood, no hallucinations   Neurologic: no focal deficit appreciated   Labs on Admission:  Basic Metabolic Panel:  Recent Labs Lab 11/04/2014 2020 11/04/14 0002 11/04/14 0510 11/05/14 0540  NA 137  --  137 131*  K 4.6  --  4.5 4.4  CL 103  --  102 99*  CO2 24  --  24 21*  GLUCOSE 274*  --  318* 214*   BUN 18  --  18 42*  CREATININE 1.19  --  1.27* 3.12*  CALCIUM 9.3  --  8.4* 7.1*  MG  --  1.8  --   --    Liver Function Tests:  Recent Labs Lab 10/20/2014 2020 11/04/14 0245 11/05/14 0540  AST 155* 331* 236*  ALT 116* 205* 250*  ALKPHOS 65 62 52  BILITOT 2.1* 1.8* 3.9*  PROT 7.6 6.6 6.1*  ALBUMIN 4.3 3.8 2.9*    Recent Labs Lab 10/24/2014 2020 11/04/14 0245 11/05/14 0540  LIPASE >3000* 936* 702*  AMYLASE  --  1225*  --    No results for input(s): AMMONIA in the last 168 hours. CBC:  Recent Labs Lab 10/20/2014 2020 11/04/14 0510 11/05/14 0540  WBC 20.4* 17.3* 15.0*  NEUTROABS 18.0*  --   --   HGB 16.4 17.0 15.3  HCT 47.5 48.9 44.1  MCV 90.3 90.6 90.4  PLT 263 227 151   Cardiac Enzymes:  Recent Labs Lab 11/04/14 0510  11/04/14 1810 11/04/14 2345 11/05/14 0540 11/05/14 1108 11/05/14 1730  CKTOTAL 3005*  --   --   --   --   --   --   CKMB >260.0*  --   --   --   --   --   --   TROPONINI 58.64*  < > >65.00* 55.08* 59.66* 45.96* 38.04*  < > = values in this interval not displayed. BNP: Invalid input(s): POCBNP CBG:  Recent Labs Lab 11/04/14 2345 11/05/14 0455 11/05/14 0732 11/05/14 1119 11/05/14 1605  GLUCAP 171* 207* 192* 182* 160*    Radiological Exams on Admission: Ct Abdomen Pelvis Wo Contrast  10/30/2014   CLINICAL DATA:  Patient with history of CHF and coronary artery disease presenting with sudden onset mid abdominal pain for 6 hours.  EXAM: CT CHEST, ABDOMEN AND PELVIS WITHOUT CONTRAST  TECHNIQUE: Multidetector CT imaging of the chest, abdomen and pelvis was performed following the standard protocol without IV contrast.  COMPARISON:  None.  FINDINGS: CT CHEST FINDINGS  Mediastinum/Nodes: Normal heart size. No pericardial effusion. Dense coronary arterial vascular calcifications. No axillary, mediastinal or hilar lymphadenopathy.  Lungs/Pleura: Central airways are patent. Depending ground-glass opacities within the bilateral lower lobes most  compatible with atelectasis. No large pleural effusion. No pneumothorax.  CT ABDOMEN AND PELVIS FINDINGS  Hepatobiliary: The liver is normal in size and contour. Multiple high-density stones are demonstrated within the gallbladder lumen. No gallbladder wall thickening or pericholecystic fluid. No intrahepatic or extrahepatic biliary ductal dilatation.  Pancreas:  There is peripancreatic fat stranding and thin fluid demonstrated. Fluid courses along the anterior right renal fascia. Unable to evaluate for pancreatic necrosis without IV contrast.  Spleen: Unremarkable  Adrenals/Urinary Tract: The adrenal glands are normal. There is mild bilateral perinephric fat stranding. Fullness within the renal pelvis bilaterally which is nonspecific however may represent multiple bilateral parapelvic cysts. The urinary bladder is unremarkable.  Stomach/Bowel: Normal appendix. No abnormal bowel wall thickening or evidence for bowel obstruction. Probable duodenum diverticulum.  Vascular/Lymphatic: Normal caliber abdominal aorta. No retroperitoneal lymphadenopathy.  Other: Prostate unremarkable. Bilateral fat containing inguinal hernias, left-greater-than-right.  Musculoskeletal: No aggressive or acute appearing osseous lesions.  IMPRESSION: Fat stranding and fluid about the pancreas compatible with acute pancreatitis. Unable to assess for necrosis without IV contrast.  Cholelithiasis without CT evidence for acute cholecystitis.   Electronically Signed   By: Annia Belt M.D.   On: 11/20/2014 20:56   Ct Chest Wo Contrast  11/14/2014   CLINICAL DATA:  Patient with history of CHF and coronary artery disease presenting with sudden onset mid abdominal pain for 6 hours.  EXAM: CT CHEST, ABDOMEN AND PELVIS WITHOUT CONTRAST  TECHNIQUE: Multidetector CT imaging of the chest, abdomen and pelvis was performed following the standard protocol without IV contrast.  COMPARISON:  None.  FINDINGS: CT CHEST FINDINGS  Mediastinum/Nodes: Normal heart  size. No pericardial effusion. Dense coronary arterial vascular calcifications. No axillary, mediastinal or hilar lymphadenopathy.  Lungs/Pleura: Central airways are patent. Depending ground-glass opacities within the bilateral lower lobes most compatible with atelectasis. No large pleural effusion. No pneumothorax.  CT ABDOMEN AND PELVIS FINDINGS  Hepatobiliary: The liver is normal in size and contour. Multiple high-density stones are demonstrated within the gallbladder lumen. No gallbladder wall thickening or pericholecystic fluid. No intrahepatic or extrahepatic biliary ductal dilatation.  Pancreas: There is peripancreatic fat stranding and thin fluid demonstrated. Fluid courses along the anterior right renal fascia. Unable to evaluate for pancreatic necrosis without IV contrast.  Spleen: Unremarkable  Adrenals/Urinary Tract: The adrenal glands are normal. There is mild bilateral perinephric fat stranding. Fullness within the renal pelvis bilaterally which is nonspecific however may represent multiple bilateral parapelvic cysts. The urinary bladder is unremarkable.  Stomach/Bowel: Normal appendix. No abnormal bowel wall thickening or evidence for bowel obstruction. Probable duodenum diverticulum.  Vascular/Lymphatic: Normal caliber abdominal aorta. No retroperitoneal lymphadenopathy.  Other: Prostate unremarkable. Bilateral fat containing inguinal hernias, left-greater-than-right.  Musculoskeletal: No aggressive or acute appearing osseous lesions.  IMPRESSION: Fat stranding and fluid about the pancreas compatible with acute pancreatitis. Unable to assess for necrosis without IV contrast.  Cholelithiasis without CT evidence for acute cholecystitis.   Electronically Signed   By: Annia Belt M.D.   On: 11/26/2014 20:56   Dg Chest Port 1 View  11/04/2014   CLINICAL DATA:  Hypoxia and shortness of breath.  EXAM: PORTABLE CHEST - 1 VIEW  COMPARISON:  Chest CT 1 day prior.  FINDINGS: Lung volumes are low leading to  accentuation of the cardiac size and crowding of bronchovascular structures. Probable bilateral perihilar atelectasis. No large pleural effusion or pneumothorax. Detailed evaluation limited by soft tissue attenuation from body habitus and low lung volumes.  IMPRESSION: Hypoventilatory chest with probable perihilar atelectasis.   Electronically Signed   By: Rubye Oaks M.D.   On: 11/04/2014 02:45   Dg Abd Portable 1v  11/04/2014   CLINICAL DATA:  Abdominal pain and distension, recent vomiting  EXAM: PORTABLE ABDOMEN - 1 VIEW  COMPARISON:  None.  FINDINGS: Stomach is  distended with air. Scattered large and small bowel gas is noted. There is some suggestion of mildly dilated small bowel loops although incompletely evaluated on this supine study.  IMPRESSION: Stomach dilated with air.  There is some suggestion of mildly dilated small bowel loops.   Electronically Signed   By: Alcide Clever M.D.   On: 11/04/2014 12:13   US Abdomen Limited Ruq  11/04/2014   CLINICAL DATA:  Upper abdominal pain and pancreatitis  EXAM: US ABDOMEN LIMITED - RIGHT UPPER QUADRANT  COMPARISON:  2014-11-18  FINDINGS: Gallbladder:  Multiple gallstones are noted. Mild wall thickening at 3.9 mm is noted. Some of this may be reactive in nature given the known pancreatitis.  Common bile duct:  Diameter: 7 mm  Liver:  Diffuse increased echogenicity is noted likely related to fatty infiltration. A small focus of decreased echogenicity is noted adjacent to the gallbladder which may be a small amount of pericholecystic fluid or focal fatty sparing.  IMPRESSION: Cholelithiasis with mild gallbladder wall thickening.  Fatty liver.  Area of decreased attenuation adjacent to the gallbladder fossa. This may be related to an area of focal fatty sparing or small amount of pericholecystic fluid.   Electronically Signed   By: Alcide Clever M.D.   On: 11/04/2014 07:59    EKG: N/A   Care during the described time interval was provided by me .  I have  reviewed this patient's available data, including medical history, events of note, physical examination, and all test results as part of my evaluation. I have personally reviewed and interpreted all radiology studies.  Time spent: 35 minutes  Drema Dallas [facility] Pager [phone]  If 7PM-7AM, please contact night-coverage [email or web address] Password [facility] 11/05/2014, 7:29 PM

## 2014-11-05 NOTE — Progress Notes (Signed)
PROGRESS NOTE  Subjective:   54 yo with hx of CHF ( LVEF 45-50%), CAD s/p PCI in 2011, HTN, and DM. Presented last night with an acute MI in the setting of pancreatitis  Still having abdominal pain  Had POBA of the prox and mid LAD. The distal LAD was very severely and diffusely disease and flow was NOT fully re-established to the apex   His BP has been a little low -   Objective:    Vital Signs:   Temp:  [95.9 F (35.5 C)-98.3 F (36.8 C)] 97.6 F (36.4 C) (08/25 1200) Pulse Rate:  [104-117] 109 (08/25 1224) Resp:  [28-41] 31 (08/25 1200) BP: (83-126)/(49-99) 117/76 mmHg (08/25 1224) SpO2:  [86 %-93 %] 91 % (08/25 1200)  Last BM Date: 10/18/2014   24-hour weight change: Weight change:   Weight trends: Filed Weights   11/07/2014 1955 11/04/2014 2318  Weight: 126.1 kg (278 lb) 126.3 kg (278 lb 7.1 oz)    Intake/Output:  08/24 0701 - 08/25 0700 In: 1898.1 [P.O.:420; I.V.:1478.1] Out: 950 [Urine:950] Total I/O In: 692.5 [P.O.:240; I.V.:452.5] Out: 275 [Urine:275]   Physical Exam: BP 117/76 mmHg  Pulse 109  Temp(Src) 97.6 F (36.4 C) (Oral)  Resp 31  Ht  (1.753 m)  Wt 126.3 kg (278 lb 7.1 oz)  BMI 41.10 kg/m2  SpO2 91%  Wt Readings from Last 3 Encounters:  10/23/2014 126.3 kg (278 lb 7.1 oz)  11/18/13 120.203 kg (265 lb)  11/03/13 120.203 kg (265 lb)    General: Vital signs reviewed and noted. , moderate distress   Head: Normocephalic, atraumatic.  Eyes: conjunctivae/corneas clear.  EOM's intact.   Throat: normal  Neck:  thick  Lungs:    clear    Heart: RR , tach   Abdomen:  Obese, tense, reduced BS   Extremities: No edema    Neurologic: A&O X3, CN II - XII are grossly intact.   Psych: Normal     Labs: BMET:  Recent Labs  11/04/14 0002 11/04/14 0510 11/05/14 0540  NA  --  137 131*  K  --  4.5 4.4  CL  --  102 99*  CO2  --  24 21*  GLUCOSE  --  318* 214*  BUN  --  18 42*  CREATININE  --  1.27* 3.12*  CALCIUM  --  8.4* 7.1*  MG  1.8  --   --     Liver function tests:  Recent Labs  11/04/14 0245 11/05/14 0540  AST 331* 236*  ALT 205* 250*  ALKPHOS 62 52  BILITOT 1.8* 3.9*  PROT 6.6 6.1*  ALBUMIN 3.8 2.9*    Recent Labs  11/04/14 0245 11/05/14 0540  LIPASE 936* 702*  AMYLASE 1225*  --     CBC:  Recent Labs  10/12/2014 2020 11/04/14 0510 11/05/14 0540  WBC 20.4* 17.3* 15.0*  NEUTROABS 18.0*  --   --   HGB 16.4 17.0 15.3  HCT 47.5 48.9 44.1  MCV 90.3 90.6 90.4  PLT 263 227 151    Cardiac Enzymes:  Recent Labs  11/04/14 0510  11/04/14 1810 11/04/14 2345 11/05/14 0540 11/05/14 1108  CKTOTAL 3005*  --   --   --   --   --   CKMB >260.0*  --   --   --   --   --   TROPONINI 58.64*  < > >65.00* 55.08* 59.66* 45.96*  < > = values in this  interval not displayed.  Coagulation Studies:  Recent Labs  12-03-2014 2020  LABPROT 13.0  INR 0.96    Other: Invalid input(s): POCBNP No results for input(s): DDIMER in the last 72 hours.  Recent Labs  11/04/14 0510  HGBA1C 6.4*    Recent Labs  11/04/14 0510  CHOL 139  HDL 81  LDLCALC 38  TRIG 99  CHOLHDL 1.7   No results for input(s): TSH, T4TOTAL, T3FREE, THYROIDAB in the last 72 hours.  Invalid input(s): FREET3 No results for input(s): VITAMINB12, FOLATE, FERRITIN, TIBC, IRON, RETICCTPCT in the last 72 hours.   Other results:  tele  ( personally reviewed )  Sinus tach at 114  Medications:    Infusions: . sodium chloride 10 mL/hr at 11/04/14 0800  . sodium chloride 75 mL/hr at 11/05/14 0700  . heparin 1,550 Units/hr (11/05/14 0700)  . nitroGLYCERIN Stopped (11/04/14 2030)    Scheduled Medications: . antiseptic oral rinse  7 mL Mouth Rinse BID  . aspirin  81 mg Oral Daily  . carvedilol  25 mg Oral BID WC  . docusate sodium  50 mg Oral BID  . enalapril  10 mg Oral BID  . insulin aspart  0-20 Units Subcutaneous 6 times per day  . insulin glargine  10 Units Subcutaneous QHS  . pantoprazole (PROTONIX) IV  40 mg  Intravenous Q24H  . polyethylene glycol  17 g Oral Daily  . simethicone  40 mg Oral TID  . sodium chloride  3 mL Intravenous Q12H  . spironolactone  12.5 mg Oral Daily  . ticagrelor  90 mg Oral BID    Assessment/ Plan:   Principal Problem:   ST elevation myocardial infarction (STEMI) of lateral wall, initial episode of care Active Problems:   Hyperlipidemia with target LDL less than 70   CAD S/P percutaneous coronary angioplasty: DES x 2 to mid-dLAD, x 1 to ostial-prox LAD   Chronic combined systolic and diastolic congestive heart failure   Acute pancreatitis   Coronary stent thrombosis   Essential hypertension   Type 2 diabetes mellitus without complication   Hypoxia   1. CAD :  Had POBA to his prox and mid LAD.  Had lots of thrombus within the stents. Dr. Herbie Baltimore was not able to open the distal LAD - it was too diffusely diseased. Continue ASA, brilinta  Will increase coreg to 25 bid Continue enalapril, lasix, aldactone  Continue heparin until tomorrow  Still has an LAD stenosis  that may need attention at a later time after he is better from a pancreatitis standpoint  ( per Dr. Herbie Baltimore )    2. Acute pancreatitis:  I've asked Int. Med. To help [place] manage   3. Hyperlipidemia:  In the REVEAL study. Will let the research team know of his admission     Disposition: will keep in CCU today . BP was a bit low this  Am Anticipate transfer to step down   Length of Stay: 2  Vesta Mixer, Montez Hageman., MD, [facility] 11/05/2014, 1:01 PM Office [phone] Pager [phone]

## 2014-11-05 NOTE — Progress Notes (Signed)
ANTICOAGULATION CONSULT NOTE - Follow Up Consult  Pharmacy Consult for heparin  Indication: STEMI  No Known Allergies  Patient Measurements: Height:  (175.3 cm) Weight: 278 lb 7.1 oz (126.3 kg) IBW/kg (Calculated) : 70.7 Heparin Dosing Weight: 99 kg  Vital Signs: Temp: 97.5 F (36.4 C) (08/25 0800) Temp Source: Oral (08/25 0800) BP: 103/70 mmHg (08/25 0900) Pulse Rate: 112 (08/25 0900)  Labs:  Recent Labs  10/29/2014 2020  11/04/14 0510  11/04/14 1810 11/04/14 2345 11/05/14 0540  HGB 16.4  --  17.0  --   --   --  15.3  HCT 47.5  --  48.9  --   --   --  44.1  PLT 263  --  227  --   --   --  151  APTT 23*  --   --   --   --   --   --   LABPROT 13.0  --   --   --   --   --   --   INR 0.96  --   --   --   --   --   --   HEPARINUNFRC  --   --   --   --  0.38  --  0.45  CREATININE 1.19  --  1.27*  --   --   --  3.12*  CKTOTAL  --   --  3005*  --   --   --   --   CKMB  --   --  >260.0*  --   --   --   --   TROPONINI <0.03  < > 58.64*  < > >65.00* 55.08* 59.66*  < > = values in this interval not displayed.  Estimated Creatinine Clearance: 35.2 mL/min (by C-G formula based on Cr of 3.12).   Medications:  Prescriptions prior to admission  Medication Sig Dispense Refill Last Dose  . acetaminophen (TYLENOL) 325 MG tablet Take 650 mg by mouth every 6 (six) hours as needed.     Past Month at Unknown time  . amLODipine (NORVASC) 5 MG tablet Take 5 mg by mouth daily.    11/04/2014 at Unknown time  . aspirin 325 MG tablet Take 325 mg by mouth daily.     11/04/2014 at Unknown time  . carvedilol (COREG) 12.5 MG tablet Take 12.5 mg by mouth 2 (two) times daily with a meal.   11/04/2014 at Unknown time  . enalapril (VASOTEC) 10 MG tablet Take 1 tablet (10 mg total) by mouth 2 (two) times daily. 60 tablet 11 11/01/2014 at Unknown time  . furosemide (LASIX) 20 MG tablet Take 20 mg by mouth daily.   11/04/2014 at Unknown time  . glimepiride (AMARYL) 4 MG tablet Take 4 mg by mouth daily.     11/04/2014 at Unknown time  . isosorbide mononitrate (IMDUR) 60 MG 24 hr tablet Take 60 mg by mouth daily.   11/04/2014 at Unknown time  . metFORMIN (GLUCOPHAGE) 1000 MG tablet Take 1,000 mg by mouth 2 (two) times daily with a meal.   11/04/2014 at Unknown time  . nitroGLYCERIN (NITROSTAT) 0.4 MG SL tablet Place 1 tablet (0.4 mg total) under the tongue every 5 (five) minutes as needed. 25 tablet prn unknown  . NON FORMULARY Take 1 tablet by mouth daily. Study Drug   11/04/2014 at Unknown time  . spironolactone (ALDACTONE) 25 MG tablet Take 12.5 mg by mouth daily.   11/04/2014 at Unknown time  .  HYDROcodone-acetaminophen (NORCO) 5-325 MG per tablet Take 1-2 tablets by mouth every 4 (four) hours as needed. 10 tablet 0 Past Week at Unknown time  . HYDROcodone-acetaminophen (NORCO/VICODIN) 5-325 MG per tablet Take 1 tablet by mouth every 4 (four) hours as needed. 15 tablet 0   . meloxicam (MOBIC) 15 MG tablet Take 1 tablet (15 mg total) by mouth daily. 7 tablet 0   . naproxen (NAPROSYN) 500 MG tablet Take 1 tablet (500 mg total) by mouth 2 (two) times daily. 10 tablet 0 Past Week at Unknown time    Assessment: 54 yo man admitted 8/24 for STEMI and acute pancreatitis with extensive thrombus to continue heparin x 48h post-cath.   CBC stable. HL 0.45, therapeutic on heparin 1550 units/h.  Goal of Therapy:  Heparin level 0.3-0.7 units/ml Monitor platelets by anticoagulation protocol: Yes   Plan:  Continue heparin 1550 units/h Monitor CBC, s/sx bleeding To end 8/26 @ 1000   [place], [place].D. PGY2 Cardiology Pharmacy Resident Pager: [phone] 11/05/2014,10:53 AM

## 2014-11-06 ENCOUNTER — Inpatient Hospital Stay (HOSPITAL_COMMUNITY): Payer: BLUE CROSS/BLUE SHIELD

## 2014-11-06 DIAGNOSIS — K858 Other acute pancreatitis: Secondary | ICD-10-CM

## 2014-11-06 DIAGNOSIS — R109 Unspecified abdominal pain: Secondary | ICD-10-CM | POA: Diagnosis present

## 2014-11-06 DIAGNOSIS — K8010 Calculus of gallbladder with chronic cholecystitis without obstruction: Secondary | ICD-10-CM | POA: Diagnosis present

## 2014-11-06 DIAGNOSIS — I5042 Chronic combined systolic (congestive) and diastolic (congestive) heart failure: Secondary | ICD-10-CM

## 2014-11-06 DIAGNOSIS — N179 Acute kidney failure, unspecified: Secondary | ICD-10-CM

## 2014-11-06 DIAGNOSIS — I213 ST elevation (STEMI) myocardial infarction of unspecified site: Secondary | ICD-10-CM

## 2014-11-06 DIAGNOSIS — R1013 Epigastric pain: Secondary | ICD-10-CM

## 2014-11-06 DIAGNOSIS — D696 Thrombocytopenia, unspecified: Secondary | ICD-10-CM | POA: Diagnosis present

## 2014-11-06 LAB — GLUCOSE, CAPILLARY
GLUCOSE-CAPILLARY: 115 mg/dL — AB (ref 65–99)
GLUCOSE-CAPILLARY: 170 mg/dL — AB (ref 65–99)
GLUCOSE-CAPILLARY: 179 mg/dL — AB (ref 65–99)
Glucose-Capillary: 176 mg/dL — ABNORMAL HIGH (ref 65–99)
Glucose-Capillary: 116 mg/dL — ABNORMAL HIGH (ref 65–99)
Glucose-Capillary: 161 mg/dL — ABNORMAL HIGH (ref 65–99)
Glucose-Capillary: 164 mg/dL — ABNORMAL HIGH (ref 65–99)

## 2014-11-06 LAB — BASIC METABOLIC PANEL
ANION GAP: 11 (ref 5–15)
ANION GAP: 12 (ref 5–15)
BUN: 71 mg/dL — ABNORMAL HIGH (ref 6–20)
BUN: 76 mg/dL — ABNORMAL HIGH (ref 6–20)
CALCIUM: 6.5 mg/dL — AB (ref 8.9–10.3)
CO2: 22 mmol/L (ref 22–32)
CO2: 19 mmol/L — AB (ref 22–32)
Calcium: 6.6 mg/dL — ABNORMAL LOW (ref 8.9–10.3)
Chloride: 97 mmol/L — ABNORMAL LOW (ref 101–111)
Chloride: 100 mmol/L — ABNORMAL LOW (ref 101–111)
Creatinine, Ser: 3.69 mg/dL — ABNORMAL HIGH (ref 0.61–1.24)
Creatinine, Ser: 2.12 mg/dL — ABNORMAL HIGH (ref 0.61–1.24)
GFR calc non Af Amer: 33 mL/min — ABNORMAL LOW (ref >60)
GFR, EST AFRICAN AMERICAN: 20 mL/min — AB (ref >60)
GFR, EST AFRICAN AMERICAN: 39 mL/min — AB (ref >60)
GFR, EST NON AFRICAN AMERICAN: 17 mL/min — AB (ref >60)
GLUCOSE: 139 mg/dL — AB (ref 65–99)
GLUCOSE: 173 mg/dL — AB (ref 65–99)
POTASSIUM: 4.1 mmol/L (ref 3.5–5.1)
Potassium: 4.8 mmol/L (ref 3.5–5.1)
Sodium: 130 mmol/L — ABNORMAL LOW (ref 135–145)
Sodium: 131 mmol/L — ABNORMAL LOW (ref 135–145)

## 2014-11-06 LAB — TROPONIN I
TROPONIN I: 13.76 ng/mL — AB (ref <0.031)
Troponin I: 33.19 ng/mL (ref <0.031)
Troponin I: 24 ng/mL (ref <0.031)
Troponin I: 17.82 ng/mL (ref <0.031)

## 2014-11-06 LAB — HEPATIC FUNCTION PANEL
ALBUMIN: 2.8 g/dL — AB (ref 3.5–5.0)
ALK PHOS: 50 U/L (ref 38–126)
ALT: 143 U/L — AB (ref 17–63)
AST: 92 U/L — AB (ref 15–41)
BILIRUBIN TOTAL: 1.9 mg/dL — AB (ref 0.3–1.2)
Bilirubin, Direct: 0.9 mg/dL — ABNORMAL HIGH (ref 0.1–0.5)
Indirect Bilirubin: 1 mg/dL — ABNORMAL HIGH (ref 0.3–0.9)
Total Protein: 6.6 g/dL (ref 6.5–8.1)

## 2014-11-06 LAB — CBC
HEMATOCRIT: 40.4 % (ref 39.0–52.0)
HEMOGLOBIN: 13.9 g/dL (ref 13.0–17.0)
MCH: 31.3 pg (ref 26.0–34.0)
MCHC: 34.4 g/dL (ref 30.0–36.0)
MCV: 91 fL (ref 78.0–100.0)
Platelets: 106 10*3/uL — ABNORMAL LOW (ref 150–400)
RBC: 4.44 MIL/uL (ref 4.22–5.81)
RDW: 13.2 % (ref 11.5–15.5)
WBC: 9.5 10*3/uL (ref 4.0–10.5)

## 2014-11-06 LAB — CK: Total CK: 497 U/L — ABNORMAL HIGH (ref 49–397)

## 2014-11-06 LAB — LACTIC ACID, PLASMA: Lactic Acid, Venous: 1.8 mmol/L (ref 0.5–2.0)

## 2014-11-06 LAB — HEPARIN LEVEL (UNFRACTIONATED): HEPARIN UNFRACTIONATED: 0.26 [IU]/mL — AB (ref 0.30–0.70)

## 2014-11-06 MED ORDER — HEPARIN SODIUM (PORCINE) 1000 UNIT/ML IJ SOLN
5000.0000 [IU] | Freq: Three times a day (TID) | INTRAMUSCULAR | Status: DC
  Filled 2014-11-06 (×3): qty 5

## 2014-11-06 MED ORDER — HEPARIN SODIUM (PORCINE) 5000 UNIT/ML IJ SOLN
5000.0000 [IU] | Freq: Three times a day (TID) | INTRAMUSCULAR | Status: DC
  Administered 2014-11-06 – 2014-11-16 (×29): 5000 [IU] via SUBCUTANEOUS
  Filled 2014-11-06 (×29): qty 1

## 2014-11-06 MED ORDER — PERFLUTREN LIPID MICROSPHERE
1.0000 mL | INTRAVENOUS | Status: AC | PRN
  Administered 2014-11-06: 4 mL via INTRAVENOUS
  Filled 2014-11-06: qty 10

## 2014-11-06 MED ORDER — LIDOCAINE VISCOUS 2 % MT SOLN
15.0000 mL | Freq: Once | OROMUCOSAL | Status: AC
  Administered 2014-11-06: 15 mL via OROMUCOSAL
  Filled 2014-11-06 (×2): qty 15

## 2014-11-06 MED ORDER — SODIUM CHLORIDE 0.9 % IV SOLN
250.0000 mL | INTRAVENOUS | Status: DC | PRN
  Administered 2014-11-06: 250 mL via INTRAVENOUS

## 2014-11-06 NOTE — Clinical Documentation Improvement (Signed)
Internal Medicine  Can the diagnosis of CKD be further specified?   CKD Stage I - GFR greater than or equal to 90  CKD Stage II - GFR 60-89  CKD Stage III - GFR 30-59  CKD Stage IV - GFR 15-29  CKD Stage V - GFR < 15  ESRD (End Stage Renal Disease)  Other condition  Unable to clinically determine   Supporting Information: : (risk factors, signs and symptoms, diagnostics, treatment) * Medical record states "Acute on Chronic renal insufficiency" * 11/04/14 GFR= > 60; 11/05/14 GFR= 21; 11/06/14 GFR= 17  Please exercise your independent, professional judgment when responding. A specific answer is not anticipated or expected.   Thank You, Cherylann Ratel Health Information Management [place]

## 2014-11-06 NOTE — Progress Notes (Addendum)
CARDIAC REHAB PHASE I   PRE:  Rate/Rhythm: 102 sT  BP:  Sitting: 118/70        SaO2: 92 6L  MODE:  Ambulation: 300 ft   POST:  Rate/Rhythm: 108 ST  BP:  Sitting: 113/72         SaO2: 86-92 6 L  Pt lying in bed, still short of breath on 6L O2, agreeable to ambulate. Pt ambulated 300 ft on 6L O2, rolling walker, IV, assist x1, steady gait, tolerated fair. Pt c/o DOE, fatigue, sats 86-92% on 6L O2 during ambulation, HR 108-128, improved with deep breathing and rest, denies CP, dizziness, brief standing rest x1. Pt returned to 2H25 per RN request (pt had transfer order), to recliner, feet elevated, call bell within reach. Will follow.   [id number]  Joylene Grapes, RN, BSN 11/06/2014 11:57 AM

## 2014-11-06 NOTE — Progress Notes (Signed)
Patient Name: Justin [name] Date of Encounter: 11/06/2014  Primary Cardiologist: Dr. Antoine Poche   Principal Problem:   ST elevation myocardial infarction (STEMI) of lateral wall, initial episode of care Active Problems:   Hyperlipidemia with target LDL less than 70   CAD S/P percutaneous coronary angioplasty: DES x 2 to mid-dLAD, x 1 to ostial-prox LAD   Chronic combined systolic and diastolic congestive heart failure   Acute pancreatitis   Coronary stent thrombosis   Essential hypertension   Type 2 diabetes mellitus without complication   Hypoxia   Abdominal distension   Diabetes type 2, controlled    SUBJECTIVE  Denies any CP, still has abdominal pain across the entire abdomen. No SOB.   CURRENT MEDS . antiseptic oral rinse  7 mL Mouth Rinse BID  . aspirin  81 mg Oral Daily  . carvedilol  25 mg Oral BID WC  . docusate sodium  50 mg Oral BID  . insulin aspart  0-20 Units Subcutaneous 6 times per day  . insulin glargine  15 Units Subcutaneous QHS  . pantoprazole (PROTONIX) IV  40 mg Intravenous Q24H  . polyethylene glycol  17 g Oral Daily  . simethicone  40 mg Oral TID  . sodium chloride  3 mL Intravenous Q12H  . ticagrelor  90 mg Oral BID    OBJECTIVE  Filed Vitals:   11/06/14 0500 11/06/14 0600 11/06/14 0700 11/06/14 0800  BP: 111/47 118/62 123/84 121/72  Pulse: 96 99 100 103  Temp:    96.3 F (35.7 C)  TempSrc:    Axillary  Resp: 27 29 28 29   Height:      Weight:      SpO2: 89% 92% 91% 93%    Intake/Output Summary (Last 24 hours) at 11/06/14 0923 Last data filed at 11/06/14 0800  Gross per 24 hour  Intake 2325.32 ml  Output    425 ml  Net 1900.32 ml   Filed Weights   11/05/2014 1955 10/21/2014 2318  Weight: 278 lb (126.1 kg) 278 lb 7.1 oz (126.3 kg)    PHYSICAL EXAM  General: Pleasant, NAD. Neuro: Alert and oriented X 3. Moves all extremities spontaneously. Psych: Normal affect. HEENT:  Normal  Neck: Supple without bruits or JVD. Lungs:  Resp  regular and unlabored, CTA. Heart: RRR no s3, s4, or murmurs. Abdomen: Significantly distended, hard, pain with palpation.   Extremities: No clubbing, cyanosis. DP/PT/Radials 2+ and equal bilaterally. Hands looks puffy.   Accessory Clinical Findings  CBC  Recent Labs  11/05/2014 2020  11/05/14 0540 11/06/14 0516  WBC 20.4*  < > 15.0* 9.5  NEUTROABS 18.0*  --   --   --   HGB 16.4  < > 15.3 13.9  HCT 47.5  < > 44.1 40.4  MCV 90.3  < > 90.4 91.0  PLT 263  < > 151 106*  < > = values in this interval not displayed. Basic Metabolic Panel  Recent Labs  11/04/14 0002  11/05/14 0540 11/06/14 0516  NA  --   < > 131* 130*  K  --   < > 4.4 4.8  CL  --   < > 99* 97*  CO2  --   < > 21* 22  GLUCOSE  --   < > 214* 139*  BUN  --   < > 42* 71*  CREATININE  --   < > 3.12* 3.69*  CALCIUM  --   < > 7.1* 6.5*  MG 1.8  --   --   --   < > =  values in this interval not displayed. Liver Function Tests  Recent Labs  11/05/14 0540 11/06/14 0516  AST 236* 92*  ALT 250* 143*  ALKPHOS 52 50  BILITOT 3.9* 1.9*  PROT 6.1* 6.6  ALBUMIN 2.9* 2.8*    Recent Labs  11/04/14 0245 11/05/14 0540  LIPASE 936* 702*  AMYLASE 1225*  --    Cardiac Enzymes  Recent Labs  11/04/14 0510  11/05/14 1730 11/05/14 2316 11/06/14 0516  CKTOTAL 3005*  --   --   --   --   CKMB >260.0*  --   --   --   --   TROPONINI 58.64*  < > 38.04* 33.19* 24.00*  < > = values in this interval not displayed. Hemoglobin A1C  Recent Labs  11/04/14 0510  HGBA1C 6.4*   Fasting Lipid Panel  Recent Labs  11/04/14 0510  CHOL 139  HDL 81  LDLCALC 38  TRIG 99  CHOLHDL 1.7    TELE NSR with HR high 90- low 100s    ECG  No new EKG  Echocardiogram 11/05/2014  - Left ventricle: Poor acoustic windows limt study. Would recomm limited study with echo contrast to further define LV function. OVreall LVEF a ppears moderately depressed with akinesis of the distal 1/3 of left ventricle The cavity size was  normal. Wall thickness was increased in a pattern of severe LVH. - Left atrium: The atrium was mildly dilated.     Radiology/Studies  Ct Abdomen Pelvis Wo Contrast  09-Nov-2014   CLINICAL DATA:  Patient with history of CHF and coronary artery disease presenting with sudden onset mid abdominal pain for 6 hours.  EXAM: CT CHEST, ABDOMEN AND PELVIS WITHOUT CONTRAST  TECHNIQUE: Multidetector CT imaging of the chest, abdomen and pelvis was performed following the standard protocol without IV contrast.  COMPARISON:  None.  FINDINGS: CT CHEST FINDINGS  Mediastinum/Nodes: Normal heart size. No pericardial effusion. Dense coronary arterial vascular calcifications. No axillary, mediastinal or hilar lymphadenopathy.  Lungs/Pleura: Central airways are patent. Depending ground-glass opacities within the bilateral lower lobes most compatible with atelectasis. No large pleural effusion. No pneumothorax.  CT ABDOMEN AND PELVIS FINDINGS  Hepatobiliary: The liver is normal in size and contour. Multiple high-density stones are demonstrated within the gallbladder lumen. No gallbladder wall thickening or pericholecystic fluid. No intrahepatic or extrahepatic biliary ductal dilatation.  Pancreas: There is peripancreatic fat stranding and thin fluid demonstrated. Fluid courses along the anterior right renal fascia. Unable to evaluate for pancreatic necrosis without IV contrast.  Spleen: Unremarkable  Adrenals/Urinary Tract: The adrenal glands are normal. There is mild bilateral perinephric fat stranding. Fullness within the renal pelvis bilaterally which is nonspecific however may represent multiple bilateral parapelvic cysts. The urinary bladder is unremarkable.  Stomach/Bowel: Normal appendix. No abnormal bowel wall thickening or evidence for bowel obstruction. Probable duodenum diverticulum.  Vascular/Lymphatic: Normal caliber abdominal aorta. No retroperitoneal lymphadenopathy.  Other: Prostate unremarkable. Bilateral fat  containing inguinal hernias, left-greater-than-right.  Musculoskeletal: No aggressive or acute appearing osseous lesions.  IMPRESSION: Fat stranding and fluid about the pancreas compatible with acute pancreatitis. Unable to assess for necrosis without IV contrast.  Cholelithiasis without CT evidence for acute cholecystitis.   Electronically Signed   By: Annia Belt M.D.   On: 11-09-14 20:56   Ct Chest Wo Contrast  2014-11-09   CLINICAL DATA:  Patient with history of CHF and coronary artery disease presenting with sudden onset mid abdominal pain for 6 hours.  EXAM: CT CHEST, ABDOMEN AND PELVIS  WITHOUT CONTRAST  TECHNIQUE: Multidetector CT imaging of the chest, abdomen and pelvis was performed following the standard protocol without IV contrast.  COMPARISON:  None.  FINDINGS: CT CHEST FINDINGS  Mediastinum/Nodes: Normal heart size. No pericardial effusion. Dense coronary arterial vascular calcifications. No axillary, mediastinal or hilar lymphadenopathy.  Lungs/Pleura: Central airways are patent. Depending ground-glass opacities within the bilateral lower lobes most compatible with atelectasis. No large pleural effusion. No pneumothorax.  CT ABDOMEN AND PELVIS FINDINGS  Hepatobiliary: The liver is normal in size and contour. Multiple high-density stones are demonstrated within the gallbladder lumen. No gallbladder wall thickening or pericholecystic fluid. No intrahepatic or extrahepatic biliary ductal dilatation.  Pancreas: There is peripancreatic fat stranding and thin fluid demonstrated. Fluid courses along the anterior right renal fascia. Unable to evaluate for pancreatic necrosis without IV contrast.  Spleen: Unremarkable  Adrenals/Urinary Tract: The adrenal glands are normal. There is mild bilateral perinephric fat stranding. Fullness within the renal pelvis bilaterally which is nonspecific however may represent multiple bilateral parapelvic cysts. The urinary bladder is unremarkable.  Stomach/Bowel: Normal  appendix. No abnormal bowel wall thickening or evidence for bowel obstruction. Probable duodenum diverticulum.  Vascular/Lymphatic: Normal caliber abdominal aorta. No retroperitoneal lymphadenopathy.  Other: Prostate unremarkable. Bilateral fat containing inguinal hernias, left-greater-than-right.  Musculoskeletal: No aggressive or acute appearing osseous lesions.  IMPRESSION: Fat stranding and fluid about the pancreas compatible with acute pancreatitis. Unable to assess for necrosis without IV contrast.  Cholelithiasis without CT evidence for acute cholecystitis.   Electronically Signed   By: Annia Belt M.D.   On: 10/28/2014 20:56   Dg Chest Port 1 View  11/04/2014   CLINICAL DATA:  Hypoxia and shortness of breath.  EXAM: PORTABLE CHEST - 1 VIEW  COMPARISON:  Chest CT 1 day prior.  FINDINGS: Lung volumes are low leading to accentuation of the cardiac size and crowding of bronchovascular structures. Probable bilateral perihilar atelectasis. No large pleural effusion or pneumothorax. Detailed evaluation limited by soft tissue attenuation from body habitus and low lung volumes.  IMPRESSION: Hypoventilatory chest with probable perihilar atelectasis.   Electronically Signed   By: Rubye Oaks M.D.   On: 11/04/2014 02:45   Dg Abd Portable 1v  11/04/2014   CLINICAL DATA:  Abdominal pain and distension, recent vomiting  EXAM: PORTABLE ABDOMEN - 1 VIEW  COMPARISON:  None.  FINDINGS: Stomach is distended with air. Scattered large and small bowel gas is noted. There is some suggestion of mildly dilated small bowel loops although incompletely evaluated on this supine study.  IMPRESSION: Stomach dilated with air.  There is some suggestion of mildly dilated small bowel loops.   Electronically Signed   By: Alcide Clever M.D.   On: 11/04/2014 12:13   US Abdomen Limited Ruq  11/04/2014   CLINICAL DATA:  Upper abdominal pain and pancreatitis  EXAM: US ABDOMEN LIMITED - RIGHT UPPER QUADRANT  COMPARISON:  10/12/2014   FINDINGS: Gallbladder:  Multiple gallstones are noted. Mild wall thickening at 3.9 mm is noted. Some of this may be reactive in nature given the known pancreatitis.  Common bile duct:  Diameter: 7 mm  Liver:  Diffuse increased echogenicity is noted likely related to fatty infiltration. A small focus of decreased echogenicity is noted adjacent to the gallbladder which may be a small amount of pericholecystic fluid or focal fatty sparing.  IMPRESSION: Cholelithiasis with mild gallbladder wall thickening.  Fatty liver.  Area of decreased attenuation adjacent to the gallbladder fossa. This may be related to an area  of focal fatty sparing or small amount of pericholecystic fluid.   Electronically Signed   By: Alcide Clever M.D.   On: 11/04/2014 07:59    ASSESSMENT AND PLAN  57M with DM, HTN, HLD, obesity and CAD s/p anterior wall MI (2011) with residual 3VD that was not amendeble to revascularization c/b LVSD (EF nadir 30-35%, improved to 45-50% in 2012) presents with lateral STEMI in the setting of pancreatitis.   1. Lateral STEMI in the setting acute pancreatitis  - emergent cath 2014-12-01 100% ost LAD to prox LAD lesion treated with balloon angioplasty only, 100% prox to mid LAD lesion treated with balloon angioplasty only, 100% mid to distal LAD lesion treated with balloon angioplasty. 90% ost D1 lesion, 80% distal LAD lesion, 40% stenosis in mid RCA, 60% distal RCA, 60% ost RPDA lesion, EF 25-35%  - The distal LAD was very severely and diffusely disease and flow was NOT fully re-established to the apex   - decision was made to proceed only with balloon angioplasty to avoid stent placement as pt will likely NPO for acute pancreatitis  - plan for PCI with new stent in the intervening segment between ost and mid LAD once recover from pancreatitis. This is going to be delayed given current renal decline.  - Echo limited accoustic window, recommended limited study with echo contrast to further assess   2.  Acute pancreatitis: Lipase >300 on arrival. significant abdominal pain, IM consulted for management  - continue to be NPO at this time.    3. Acute on chronic renal insufficiency   - Cr 1.19 on arrival 2014/12/01, jumped to 3.12 yesterday and 3.69 this morning  - will discuss with MD, potentially consult nephrology, timing of Cr rise raise concern for significant contrast nephropathy. BP improved to 120s today, given current BP, seriously doubt low perfusion/low CO. Did receive 40mg  IV lasix x2 after cath for LVEDP of 31.   4. Acute on chronic combined systolic and diastolic HF  - found to have severely elevated LVEDP 31 on cath  - will avoid lasix as much as possible given current decline.  5. CAD s/p anterior MI 2011 with residual 3vd that was not amenable to revascularization  6. HTN  7. HLD  8. DM  Signed, Amedeo Plenty Pager: [phone]   Attending Note:   The patient was seen and examined.  Agree with assessment and plan as noted above.  Changes made to the above note as needed.  1. CAD :  Has a severely disease LAD .  There is some thought to stenting the mid LAD but this will need to be re-evaluated completely once he is over this acute pancreatitis .  Ok to stop heparin  He is doing better from a cardiac standpoint. No CP   BP is a bit low - we may need to increase his IVF  2. Acute renal insufficiency :  He may be 3rd spacing fluid in his abdomen and is therefore more intravascularly depleted than we would expect . We need to be careful given his LV dysfunction   3. Acute renal insufficiency :   Possible volume depletion  - will have Int. Med team address.   Vesta Mixer, Montez Hageman., MD, [facility] 11/06/2014, 10:40 AM [address]  [address] Office [phone] Pager [phone]

## 2014-11-06 NOTE — Research (Signed)
Mr. Justin [name] has been participating in the REVEAL Research Study since 06/28/10. The REVEAL Study is evaluating the effects of Anacetrapib, a CETP inhibitor, on a background of atorvastatin in the prevention of MI, stroke and death.  The subject was taking atorvastatin 20 mg and either Anacetrapib 100 mg or placebo daily. The subject has tolerated the Study Drug since 06/28/10 without any adverse events.   It is recommended that the subject stop the Study Drug at this time.  Please direct any questions regarding the study to Claire Shown, RN, Research Study Coordinator at [phone].

## 2014-11-06 NOTE — Progress Notes (Signed)
ANTICOAGULATION CONSULT NOTE - Follow Up Consult  Pharmacy Consult for heparin  Indication: STEMI  No Known Allergies  Patient Measurements: Height:  (175.3 cm) Weight: 278 lb 7.1 oz (126.3 kg) IBW/kg (Calculated) : 70.7 Heparin Dosing Weight: 99 kg  Vital Signs: Temp: 97.9 F (36.6 C) (08/26 0000) Temp Source: Oral (08/26 0000) BP: 103/60 mmHg (08/26 0300) Pulse Rate: 94 (08/26 0300)  Labs:  Recent Labs  10/29/2014 2020  11/04/14 0510  11/04/14 1810  11/05/14 0540 11/05/14 1108 11/05/14 1730 11/05/14 2316 11/06/14 0216  HGB 16.4  --  17.0  --   --   --  15.3  --   --   --   --   HCT 47.5  --  48.9  --   --   --  44.1  --   --   --   --   PLT 263  --  227  --   --   --  151  --   --   --   --   APTT 23*  --   --   --   --   --   --   --   --   --   --   LABPROT 13.0  --   --   --   --   --   --   --   --   --   --   INR 0.96  --   --   --   --   --   --   --   --   --   --   HEPARINUNFRC  --   --   --   --  0.38  --  0.45  --   --   --  0.26*  CREATININE 1.19  --  1.27*  --   --   --  3.12*  --   --   --   --   CKTOTAL  --   --  3005*  --   --   --   --   --   --   --   --   CKMB  --   --  >260.0*  --   --   --   --   --   --   --   --   TROPONINI <0.03  < > 58.64*  < > >65.00*  < > 59.66* 45.96* 38.04* 33.19*  --   < > = values in this interval not displayed.  Estimated Creatinine Clearance: 35.2 mL/min (by C-G formula based on Cr of 3.12).  Assessment: 54 yo man admitted 8/24 for STEMI and acute pancreatitis with extensive thrombus to continue heparin x 48h post-cath. Heparin level down to 0.26 (subtherapeutic) on heparin 1550 units/h. No bleeding noted or issues with infusion.  Goal of Therapy:  Heparin level 0.3-0.7 units/ml Monitor platelets by anticoagulation protocol: Yes   Plan:  Increase heparin to 1650 units/h Will not do f/u levels as heparin to end today at 1000  Christoper Fabian, PharmD, BCPS Clinical pharmacist, pager [phone] 11/06/2014,4:07  AM

## 2014-11-06 NOTE — Progress Notes (Signed)
  Echocardiogram 2D Echocardiogram has been performed.  [name] 11/06/2014, 6:11 PM

## 2014-11-06 NOTE — Consult Note (Signed)
[facility] Medical Consultation  Adir Schicker ZOX:[id number] DOB: 06/08/1960 DOA: 10/15/2014 PCP: [name], MD   Requesting physician: Dr. Elease Hashimoto  Date of consultation: 11/04/14 Reason for consultation: Abd pain, Diabetes    Impression/Recommendations Principal Problem:   ST elevation myocardial infarction (STEMI) of lateral wall, initial episode of care Active Problems:   Hyperlipidemia with target LDL less than 70   CAD S/P percutaneous coronary angioplasty: DES x 2 to mid-dLAD, x 1 to ostial-prox LAD   Chronic combined systolic and diastolic congestive heart failure   Acute pancreatitis   Coronary stent thrombosis   Essential hypertension   Type 2 diabetes mellitus without complication   Hypoxia   Abdominal distension   Diabetes type 2, controlled   Abdominal pain   Cholelithiasis with cholecystitis of other acuity   Acute renal failure syndrome   Thrombocytopenia    ST elevation myocardial infarction (STEMI) of lateral wall: -s/p POBA to prox and mid LAD -further care per cardiology service -patient on ASA, COREG, ACE, Aldactone and brillinta  -Echocardiogram pending -Recommend stopping heparin if thrombocytopenia continues to worsen  Chronic combined systolic and diastolic congestive heart failure -per cardiology service -appears to be compensated currently   HLD -target LDL< 70 -was not on statins at home -holding statin currently given transaminitis  Abdominal pain  -Secondary to pancreatitis; lipase trending down but still significantly elevated -Abdominal ultrasound; no stone  -PRN antiemetics and analgesics -will add simethicone and some laxatives as he is complaining of obstipation/constipation -continue NPO status except for sips with medications  -AST mainly elevated due to MI  Gastric and bowel distention -Significant gastric and bowel distention with air; patient requires decompression -NG tube to be placed and set to low  continuous suction  Diabetes Mellitus type 2 controlled  -8/24 hemoglobin A1c = 6.4   -Increase Lantus to 15 units daily -Continue resistant SSI  Cholelithiasis:  - no signs of acute cholecystitis appreciated at this moment -will provide supportive care for pancreatitis -given acute MI no surgery recommended at this point -follow up in the future for elective intervention  Cholecystitis:  -due to pancreatitis and stress demargination -no fever -will monitor trend -no need for antibiotics currently  Acute renal failure -Most likely multifactorial to include STEMI, and contrast load  -Will try to gently hydrate; normal saline 74ml/hr -Strict I&O -Family and patient counseled that if the creatinine continues to trend up overnight will consult nephrology in the a.m. ADDENDUM; 8/26 Cr = 2.1  Thrombocytopenia -Hit panel pending    I will followup again tomorrow. Please contact me if I can be of assistance in the meanwhile. Thank you for this consultation.  Chief Complaint: Multiple medical problems  HPI:  13 WM PMHx DM Type 2, HTN, systolic CHF, CAD s/p anterior wall MI (2011) with residual 3VD that was not amendeble to revascularization c/b LVSD (EF nadir 30-35%, improved to 45-50% in 2012) HLD, obesity.  Presents with lateral STEMI in the setting of pancreatitis.   Review of symptoms The patient denies anorexia, fever, weight loss,, vision loss, decreased hearing, hoarseness, chest pain, syncope, dyspnea on exertion, peripheral edema, balance deficits, hemoptysis, abdominal pain, melena, hematochezia, severe indigestion/heartburn, hematuria, incontinence, genital sores, muscle weakness, suspicious skin lesions, transient blindness, difficulty walking, depression, unusual weight change, abnormal bleeding, enlarged lymph nodes, angioedema, and breast masses.   Consultants:   Procedure/Significant Events:    Culture    Antibiotics:   DVT prophylaxis:    Past  Medical History  Diagnosis Date  .  CHF 12/14/2009    EF 35% at the time of MI.  50% echo 6/12  . CAD S/P percutaneous coronary angioplasty     November 14, 2009 anterior MI LAD 80% ostial, 70% proximal followed by total occlusion, ramus intermediate total occlusion circumflex 80%, PDA 90% lesions. Overlapping drug-eluting stents to the LAD  . ST elevation myocardial infarction (STEMI) of anterior wall 11/13/2009  . Diabetes mellitus   . HTN (hypertension)   . Hyperlipidemia   . Diabetes mellitus   . Overweight    Past Surgical History  Procedure Laterality Date  . Cardiac catheterization N/A 11-30-14    Procedure: Left Heart Cath and Coronary Angiography;  Surgeon: Marykay Lex, MD;  Location: [facility] INVASIVE CV LAB;  Service: Cardiovascular;  Laterality: N/A;  . Cardiac catheterization N/A Nov 30, 2014    Procedure: Coronary Stent Intervention;  Surgeon: Marykay Lex, MD;  Location: [facility] INVASIVE CV LAB;  Service: Cardiovascular;  Laterality: N/A;   Social History:  reports that he has never smoked. He has never used smokeless tobacco. He reports that he does not drink alcohol. His drug history is not on file.  No Known Allergies Family History  Problem Relation Age of Onset  . Coronary artery disease    . Heart attack    . Cardiomyopathy    . Heart disease      Prior to Admission medications   Medication Sig Start Date End Date Taking? Authorizing Provider  acetaminophen (TYLENOL) 325 MG tablet Take 650 mg by mouth every 6 (six) hours as needed.     Yes Historical Provider, MD  amLODipine (NORVASC) 5 MG tablet Take 5 mg by mouth daily.  05/19/13  Yes Historical Provider, MD  aspirin 325 MG tablet Take 325 mg by mouth daily.     Yes Historical Provider, MD  carvedilol (COREG) 12.5 MG tablet Take 12.5 mg by mouth 2 (two) times daily with a meal.   Yes Historical Provider, MD  enalapril (VASOTEC) 10 MG tablet Take 1 tablet (10 mg total) by mouth 2 (two) times daily. 06/27/10  Yes Rollene Rotunda, MD  furosemide (LASIX) 20 MG tablet Take 20 mg by mouth daily.   Yes Historical Provider, MD  glimepiride (AMARYL) 4 MG tablet Take 4 mg by mouth daily.  05/19/13  Yes Historical Provider, MD  isosorbide mononitrate (IMDUR) 60 MG 24 hr tablet Take 60 mg by mouth daily.   Yes Historical Provider, MD  metFORMIN (GLUCOPHAGE) 1000 MG tablet Take 1,000 mg by mouth 2 (two) times daily with a meal.   Yes Historical Provider, MD  nitroGLYCERIN (NITROSTAT) 0.4 MG SL tablet Place 1 tablet (0.4 mg total) under the tongue every 5 (five) minutes as needed. 07/10/13  Yes Rollene Rotunda, MD  NON FORMULARY Take 1 tablet by mouth daily. Study Drug   Yes Historical Provider, MD  spironolactone (ALDACTONE) 25 MG tablet Take 12.5 mg by mouth daily.   Yes Historical Provider, MD  HYDROcodone-acetaminophen (NORCO) 5-325 MG per tablet Take 1-2 tablets by mouth every 4 (four) hours as needed. 11/03/13   Blane Ohara, MD  HYDROcodone-acetaminophen (NORCO/VICODIN) 5-325 MG per tablet Take 1 tablet by mouth every 4 (four) hours as needed. 11/18/13   Ivery Quale, PA-C  meloxicam (MOBIC) 15 MG tablet Take 1 tablet (15 mg total) by mouth daily. 11/18/13   Ivery Quale, PA-C  naproxen (NAPROSYN) 500 MG tablet Take 1 tablet (500 mg total) by mouth 2 (two) times daily. 11/03/13   Blane Ohara, MD  Physical Exam: Blood pressure 123/49, pulse 119, temperature 98.7 F (37.1 C), temperature source Oral, resp. rate 29, height 5\' 9"  (1.753 m), weight 126.3 kg (278 lb 7.1 oz), SpO2 91 %. Filed Vitals:   11/06/14 1700 11/06/14 1714 11/06/14 1900 11/06/14 2000  BP:  123/49    Pulse: 109 110 119   Temp:    98.7 F (37.1 C)  TempSrc:    Oral  Resp: 33  29   Height:      Weight:      SpO2: 94%  91%       General: A/OX4, moderate abdominal pain secondary to distention, negative N/V, negative CP. Sitting in chair comfortably  Eyes: PERRL, no icterus, no nystagmus  ENT: no erythema or exudates inside his mouth; Girard in  place; no drainage out of ears or nostrils   Neck: no thyromegaly, no bruits, short thick neck   Cardiovascular: S1 and S2, no rubs or gallops, no murmurs   Respiratory: CTA bilaterally   Abdomen: Hypoactive BS, distended, positive tenderness to palpation in epigastric region mainly   Skin: no open wounds, no petechiae, no rash  Musculoskeletal: no joint swelling appreciated,   Psychiatric: stable mood, no hallucinations   Neurologic: no focal deficit appreciated   Labs on Admission:  Basic Metabolic Panel:  Recent Labs Lab 11/06/2014 2020 11/04/14 0002 11/04/14 0510 11/05/14 0540 11/06/14 0516 11/06/14 1923  NA 137  --  137 131* 130* 131*  K 4.6  --  4.5 4.4 4.8 4.1  CL 103  --  102 99* 97* 100*  CO2 24  --  24 21* 22 19*  GLUCOSE 274*  --  318* 214* 139* 173*  BUN 18  --  18 42* 71* 76*  CREATININE 1.19  --  1.27* 3.12* 3.69* 2.12*  CALCIUM 9.3  --  8.4* 7.1* 6.5* 6.6*  MG  --  1.8  --   --   --   --    Liver Function Tests:  Recent Labs Lab 11/02/2014 2020 11/04/14 0245 11/05/14 0540 11/06/14 0516  AST 155* 331* 236* 92*  ALT 116* 205* 250* 143*  ALKPHOS 65 62 52 50  BILITOT 2.1* 1.8* 3.9* 1.9*  PROT 7.6 6.6 6.1* 6.6  ALBUMIN 4.3 3.8 2.9* 2.8*    Recent Labs Lab 11/09/2014 2020 11/04/14 0245 11/05/14 0540  LIPASE >3000* 936* 702*  AMYLASE  --  1225*  --    No results for input(s): AMMONIA in the last 168 hours. CBC:  Recent Labs Lab 10/15/2014 2020 11/04/14 0510 11/05/14 0540 11/06/14 0516  WBC 20.4* 17.3* 15.0* 9.5  NEUTROABS 18.0*  --   --   --   HGB 16.4 17.0 15.3 13.9  HCT 47.5 48.9 44.1 40.4  MCV 90.3 90.6 90.4 91.0  PLT 263 227 151 106*   Cardiac Enzymes:  Recent Labs Lab 11/04/14 0510  11/05/14 1730 11/05/14 2316 11/06/14 0516 11/06/14 1132 11/06/14 1923  CKTOTAL 3005*  --   --   --   --   --  497*  CKMB >260.0*  --   --   --   --   --   --   TROPONINI 58.64*  < > 38.04* 33.19* 24.00* 17.82* 13.76*  < > = values in  this interval not displayed. BNP: Invalid input(s): POCBNP CBG:  Recent Labs Lab 11/06/14 0014 11/06/14 0432 11/06/14 0752 11/06/14 1210 11/06/14 1621  GLUCAP 176* 116* 115* 170* 161*    Radiological Exams on Admission:  No results found.  EKG: N/A   Care during the described time interval was provided by me .  I have reviewed this patient's available data, including medical history, events of note, physical examination, and all test results as part of my evaluation. I have personally reviewed and interpreted all radiology studies.  Time spent: 35 minutes  Drema Dallas [facility] Pager [phone]  If 7PM-7AM, please contact night-coverage [email or web address] Password [email or web address] 11/06/2014, 8:40 PM

## 2014-11-06 NOTE — Progress Notes (Signed)
Attempted to insert NG tube with Suzy Bouchard, RN at this time. Unsuccessful attempt. Minimal blood coming from right nare. RN will pass on in report.

## 2014-11-07 ENCOUNTER — Inpatient Hospital Stay (HOSPITAL_COMMUNITY): Payer: BLUE CROSS/BLUE SHIELD

## 2014-11-07 LAB — CBC WITH DIFFERENTIAL/PLATELET
BASOS ABS: 0 10*3/uL (ref 0.0–0.1)
Basophils Relative: 0 % (ref 0–1)
EOS PCT: 0 % (ref 0–5)
Eosinophils Absolute: 0 10*3/uL (ref 0.0–0.7)
HEMATOCRIT: 37 % — AB (ref 39.0–52.0)
Hemoglobin: 12.8 g/dL — ABNORMAL LOW (ref 13.0–17.0)
LYMPHS ABS: 0.2 10*3/uL — AB (ref 0.7–4.0)
LYMPHS PCT: 3 % — AB (ref 12–46)
MCH: 30.8 pg (ref 26.0–34.0)
MCHC: 34.6 g/dL (ref 30.0–36.0)
MCV: 88.9 fL (ref 78.0–100.0)
MONO ABS: 0.6 10*3/uL (ref 0.1–1.0)
MONOS PCT: 6 % (ref 3–12)
Neutro Abs: 8.2 10*3/uL — ABNORMAL HIGH (ref 1.7–7.7)
Neutrophils Relative %: 91 % — ABNORMAL HIGH (ref 43–77)
PLATELETS: 110 10*3/uL — AB (ref 150–400)
RBC: 4.16 MIL/uL — ABNORMAL LOW (ref 4.22–5.81)
RDW: 13.3 % (ref 11.5–15.5)
WBC: 9 10*3/uL (ref 4.0–10.5)

## 2014-11-07 LAB — GLUCOSE, CAPILLARY
GLUCOSE-CAPILLARY: 179 mg/dL — AB (ref 65–99)
GLUCOSE-CAPILLARY: 177 mg/dL — AB (ref 65–99)
GLUCOSE-CAPILLARY: 155 mg/dL — AB (ref 65–99)
Glucose-Capillary: 145 mg/dL — ABNORMAL HIGH (ref 65–99)
Glucose-Capillary: 143 mg/dL — ABNORMAL HIGH (ref 65–99)

## 2014-11-07 LAB — BASIC METABOLIC PANEL
Anion gap: 9 (ref 5–15)
BUN: 63 mg/dL — AB (ref 6–20)
CHLORIDE: 103 mmol/L (ref 101–111)
CO2: 20 mmol/L — AB (ref 22–32)
CREATININE: 1.38 mg/dL — AB (ref 0.61–1.24)
Calcium: 6.6 mg/dL — ABNORMAL LOW (ref 8.9–10.3)
GFR calc Af Amer: >60 mL/min (ref >60)
GFR calc non Af Amer: 56 mL/min — ABNORMAL LOW (ref >60)
GLUCOSE: 162 mg/dL — AB (ref 65–99)
POTASSIUM: 3.6 mmol/L (ref 3.5–5.1)
Sodium: 132 mmol/L — ABNORMAL LOW (ref 135–145)

## 2014-11-07 LAB — TROPONIN I
TROPONIN I: 13.1 ng/mL — AB (ref <0.031)
Troponin I: 10.15 ng/mL (ref <0.031)
Troponin I: 8.41 ng/mL (ref <0.031)
Troponin I: 8.3 ng/mL (ref <0.031)

## 2014-11-07 LAB — MAGNESIUM: MAGNESIUM: 1.9 mg/dL (ref 1.7–2.4)

## 2014-11-07 LAB — LIPASE, BLOOD: Lipase: 52 U/L — ABNORMAL HIGH (ref 22–51)

## 2014-11-07 MED ORDER — BISACODYL 10 MG RE SUPP: 10.0000 mg | Freq: Every day | RECTAL | Status: DC | PRN

## 2014-11-07 MED ORDER — SENNOSIDES-DOCUSATE SODIUM 8.6-50 MG PO TABS
1.0000 | ORAL_TABLET | Freq: Two times a day (BID) | ORAL | Status: DC
  Administered 2014-11-07 – 2014-11-15 (×13): 1 via ORAL
  Filled 2014-11-07 (×14): qty 1

## 2014-11-07 MED ORDER — LORAZEPAM 0.5 MG PO TABS
0.5000 mg | ORAL_TABLET | Freq: Two times a day (BID) | ORAL | Status: DC | PRN
  Administered 2014-11-07 – 2014-11-15 (×5): 0.5 mg via ORAL
  Filled 2014-11-07 (×5): qty 1

## 2014-11-07 NOTE — Progress Notes (Addendum)
NG tube placed at 2130 and set to low- intermittent suction. of brown gastric content collected. Patient's abdomen less taut than before. Patient states he feels better and his abdomen is no longer tender. Will continue to monitor.

## 2014-11-07 NOTE — Progress Notes (Addendum)
Patient still confused, he thinks his family member is in the room talking to him. He still agitated and wants to leave. I have educated him again on why he should stay in the hospital. Patient is getting irritated with me because I keep reminding him to keep his nasal cannula  on and to use the call light instead of jumping out of the bed/chair. Will continue to monitor.

## 2014-11-07 NOTE — Progress Notes (Signed)
Pt currently without IV access. Refusing to allow IV insertion attempt at this time. States "I'm leaving soon". Agreeable to waiting to speak with MD.

## 2014-11-07 NOTE — Progress Notes (Signed)
Subjective: Alert and oriented.  Does not want to be here.  No CP, SOB or abd pain  Objective: Vital signs in last 24 hours: Temp:  [98 F (36.7 C)-98.7 F (37.1 C)] 98.7 F (37.1 C) (08/27 0808) Pulse Rate:  [102-119] 104 (08/27 0737) Resp:  [17-34] 17 (08/27 0737) BP: (105-125)/(49-88) 116/73 mmHg (08/27 0737) SpO2:  [90 %-96 %] 96 % (08/27 0737) Last BM Date: 10/31/2014  Intake/Output from previous day: 08/26 0701 - 08/27 0700 In: 1141.5 [I.V.:1141.5] Out: 2425 [Urine:2225; Emesis/NG output:200] Intake/Output this shift: Total I/O In: 93.8 [I.V.:93.8] Out: -   Medications Scheduled Meds: . antiseptic oral rinse  7 mL Mouth Rinse BID  . aspirin  81 mg Oral Daily  . carvedilol  25 mg Oral BID WC  . docusate sodium  50 mg Oral BID  . heparin subcutaneous  5,000 Units Subcutaneous 3 times per day  . insulin aspart  0-20 Units Subcutaneous 6 times per day  . insulin glargine  15 Units Subcutaneous QHS  . pantoprazole (PROTONIX) IV  40 mg Intravenous Q24H  . polyethylene glycol  17 g Oral Daily  . simethicone  40 mg Oral TID  . sodium chloride  3 mL Intravenous Q12H  . ticagrelor  90 mg Oral BID   Continuous Infusions: . sodium chloride Stopped (11/06/14 0700)  . nitroGLYCERIN Stopped (11/04/14 2030)   PRN Meds:.sodium chloride, acetaminophen, guaiFENesin-dextromethorphan, HYDROmorphone (DILAUDID) injection, labetalol, metoprolol, morphine injection, nitroGLYCERIN, ondansetron (ZOFRAN) IV, sodium chloride  PE: General appearance: alert, cooperative and no distress Lungs: Decreased BS throughout.  No wheeze.  Heart: regular rate and rhythm, S1, S2 normal, no murmur, click, rub or gallop Abdomen: +BS and distension,  Nontender Extremities: No LEE Pulses: 2+ and symmetric Skin: Warm and dry Neurologic: Grossly normal  Lab Results:   Recent Labs  11/05/14 0540 11/06/14 0516 11/07/14 0559  WBC 15.0* 9.5 9.0  HGB 15.3 13.9 12.8*  HCT 44.1 40.4 37.0*  PLT  151 106* 110*   BMET  Recent Labs  11/06/14 0516 11/06/14 1923 11/07/14 0559  NA 130* 131* 132*  K 4.8 4.1 3.6  CL 97* 100* 103  CO2 22 19* 20*  GLUCOSE 139* 173* 162*  BUN 71* 76* 63*  CREATININE 3.69* 2.12* 1.38*  CALCIUM 6.5* 6.6* 6.6*      Assessment/Plan   1. Lateral STEMI in the setting acute pancreatitis - emergent cath 10/22/2014 100% ost LAD to prox LAD lesion treated with balloon angioplasty only, 100% prox to mid LAD lesion treated with balloon angioplasty only, 100% mid to distal LAD lesion treated with balloon angioplasty. 90% ost D1 lesion, 80% distal LAD lesion, 40% stenosis in mid RCA, 60% distal RCA, 60% ost RPDA lesion, EF 25-35% - The distal LAD was severely and diffusely disease and flow was NOT fully re-established to the apex  - decision was made to proceed only with balloon angioplasty to avoid stent placement as pt will likely NPO for acute pancreatitis - plan for PCI with new stent in the intervening segment between ost and mid LAD once recover from pancreatitis. This is going to be delayed given current renal decline. - Echo limited accoustic window,   Limited echo done but no results yet.  2. Acute pancreatitis: Lipase >300 on arrival. significant abdominal pain, IM consulted for management - continue to be NPO at this time.   3. Acute on chronic renal insufficiency - Cr 1.19 on arrival 11/08/2014, jumped to 3.12 yesterday then 3.69.  Now 1.38 today. -Receive 40mg  IV lasix x2 after cath for LVEDP of 31.   4. Acute on chronic combined systolic and diastolic HF - found to have severely elevated LVEDP 31 on cath - will avoid lasix as much as possible given current decline. - Net fluids: -1.9L/+0.5L  5. CAD s/p anterior MI 2011 with residual 3vd that was not amenable to revascularization  6. HTN  Controlled 7. HLD  8. DM  Refusing IV.  Family will help  convince him.    LOS: 4 days    HAGER, BRYAN PA-C 11/07/2014 8:25 AM  Cardiology Attending  Patient seen and examined. Agree with above. The patient has been agitated and pulled out IV and NG tube. He is more calm now that his girlfriend present. He has no IV access and I considered having a PICC placed. For now I would hold off on this until we know more about how he is going to do with confusion. He denies chest pain or abdominal pain. He remains NPO.  Leonia Reeves.D.

## 2014-11-07 NOTE — Progress Notes (Signed)
Patient pulled out NG tube. Patient seems a bit confused. He states his truck is here and he is ready to go home. Educated patient on why he should stay. Vital signs stable. Will continue to monitor. MD notified. No new orders given.

## 2014-11-07 NOTE — Progress Notes (Signed)
Patient ID: Justin [name], male   DOB: Apr 03, 1960, 54 y.o.   MRN: [id number]  [facility] PROGRESS NOTE  Tobiah Celestine WUJ:[id number] DOB: 05/29/1960 DOA: 11/11/2014 PCP: [name], MD   Brief narrative:    52M with DM, HTN, HLD, obesity and CAD s/p anterior wall MI (2011) with residual 3VD that was not amendeble to revascularization c/b LVSD (EF nadir 30-35%, improved to 45-50% in 2012) presented August 23rd, 2016 with lateral STEMI in the setting of acute pancreatitis. Admitted under PCCM and cardiology team. [facility] was consulted by cardiology team for evaluation of abd pain and pancreatitis.   Assessment/Plan:    ST elevation myocardial infarction (STEMI) of lateral wall: - s/p POBA to prox and mid LAD - further care per cardiology service - patient on ASA, COREG, ACE, Aldactone and brillinta  - plan for PCI with new stent once pt recovers from pancreatitis   Abd pain secondary to acute pancreatitis, ? Gallstone  - lipase on admission >  3000 and currently down to 52 - LFT's also elevated with [place] notable for cholelithiasis and gallbladder wall thickening - LFT's trending down but still elevated and bilirubin as high as 3.9 - I am worried about dislodged stone (pt may have already passed stone) - will therefore speak with GI on call  Ileus - pulled out NGT - OK to keep out for now - placed on bowel regimen   Acute on chronic combined systolic and diastolic CHF - management per cardiology team - still with +1 LE edema, per cardiology avoiding lasix as possible - weight on admission 278 lbs, will ask to monitor daily weights and strict I/O  Acute renal failure - Cr trend since admission: 1.19 --> 3.12 --> 3.69 --> 1.38 - monitor UOP - repeat BMP in AM  Hyponatremia - improving - repeat BMP in AM  HLD - target LDL< 70 - was not on statins at home - holding statin currently given transaminitis  Diabetes Mellitus type 2 controlled  - 8/24 hemoglobin A1c =  6.4  - Increased Lantus to 15 units daily - Continue resistant SSI  Thrombocytopenia - Hit panel pending - if Plt continue to drop, may need to hold Heparin products  Morbid obesity - Body mass index is 41.1 kg/(m^2).  DVT prophylaxis - Heparin SQ  Code Status: Full.  Family Communication:  plan of care discussed with the family at bedside  Disposition Plan: Not ready for d/c at this time  IV access:  Peripheral IV  Procedures and diagnostic studies:     Ct Chest Wo Contrast 10/21/2014  Fat stranding and fluid about the pancreas compatible with acute pancreatitis. Unable to assess for necrosis without IV contrast.  Cholelithiasis without CT evidence for acute cholecystitis.     Dg Chest Port 1v Same Day 11/07/2014  Enlargement of cardiac silhouette.  Low lung volumes with bibasilar atelectasis.     Dg Abd Portable 1v 11/04/2014  Stomach dilated with air.  There is some suggestion of mildly dilated small bowel loops.    US Abdomen Limited Ruq 11/04/2014   Cholelithiasis with mild gallbladder wall thickening.  Fatty liver.  Area of decreased attenuation adjacent to the gallbladder fossa. This may be related to an area of focal fatty sparing or small amount of pericholecystic fluid.     Medical Consultants:  [facility] consulting team GI   Other Consultants:  PT  IAnti-Infectives:   None   Debbora Presto, MD  [facility] Pager [phone]  If 7PM-7AM, please contact night-coverage  [email or web address] Password [facility] 11/07/2014, 5:45 PM   LOS: 4 days   HPI/Subjective: No events overnight. Pulled NGT out this AM  Objective: Filed Vitals:   11/07/14 0909 11/07/14 1207 11/07/14 1642 11/07/14 1731  BP: 130/65 126/81 133/81 127/76  Pulse: 103 104 102 105  Temp:  98.8 F (37.1 C) 97.6 F (36.4 C)   TempSrc:  Oral Oral   Resp:  21 21   Height:      Weight:      SpO2: 95% 95% 96% 93%    Intake/Output Summary (Last 24 hours) at 11/07/14 1745 Last data filed at 11/07/14 1556   Gross per 24 hour  Intake 1068.75 ml  Output   2375 ml  Net -1306.25 ml    Exam:   General:  Pt is alert, follows commands appropriately, not in acute distress  Cardiovascular: Regular rhythm, tachycardic, no rubs, no gallops  Respiratory: Clear to auscultation bilaterally, no wheezing, diminished breath sounds at bases   Abdomen: Soft, non tender, slightly distended, bowel sounds faint   Extremities: +1 bilateral LE edema, pulses DP and PT palpable bilaterally  Neuro: Grossly nonfocal  Data Reviewed: Basic Metabolic Panel:  Recent Labs Lab 11/04/14 0002 11/04/14 0510 11/05/14 0540 11/06/14 0516 11/06/14 1923 11/07/14 0559  NA  --  137 131* 130* 131* 132*  K  --  4.5 4.4 4.8 4.1 3.6  CL  --  102 99* 97* 100* 103  CO2  --  24 21* 22 19* 20*  GLUCOSE  --  318* 214* 139* 173* 162*  BUN  --  18 42* 71* 76* 63*  CREATININE  --  1.27* 3.12* 3.69* 2.12* 1.38*  CALCIUM  --  8.4* 7.1* 6.5* 6.6* 6.6*  MG 1.8  --   --   --   --  1.9   Liver Function Tests:  Recent Labs Lab Nov 09, 2014 2020 11/04/14 0245 11/05/14 0540 11/06/14 0516  AST 155* 331* 236* 92*  ALT 116* 205* 250* 143*  ALKPHOS 65 62 52 50  BILITOT 2.1* 1.8* 3.9* 1.9*  PROT 7.6 6.6 6.1* 6.6  ALBUMIN 4.3 3.8 2.9* 2.8*    Recent Labs Lab 09-Nov-2014 2020 11/04/14 0245 11/05/14 0540 11/07/14 0559  LIPASE >3000* 936* 702* 52*  AMYLASE  --  1225*  --   --    CBC:  Recent Labs Lab 2014/11/09 2020 11/04/14 0510 11/05/14 0540 11/06/14 0516 11/07/14 0559  WBC 20.4* 17.3* 15.0* 9.5 9.0  NEUTROABS 18.0*  --   --   --  8.2*  HGB 16.4 17.0 15.3 13.9 12.8*  HCT 47.5 48.9 44.1 40.4 37.0*  MCV 90.3 90.6 90.4 91.0 88.9  PLT 263 227 151 106* 110*   Cardiac Enzymes:  Recent Labs Lab 11/04/14 0510  11/06/14 1132 11/06/14 1923 11/06/14 2311 11/07/14 0559 11/07/14 1128  CKTOTAL 3005*  --   --  497*  --   --   --   CKMB >260.0*  --   --   --   --   --   --   TROPONINI 58.64*  < > 17.82* 13.76* 13.10*  10.15* 8.41*  < > = values in this interval not displayed.  CBG:  Recent Labs Lab 11/06/14 2328 11/07/14 0326 11/07/14 0741 11/07/14 1208 11/07/14 1647  GLUCAP 179* 145* 179* 177* 155*    Recent Results (from the past 240 hour(s))  MRSA PCR Screening     Status: None   Collection Time: 11/09/14 11:17 PM  Result Value Ref  Range Status   MRSA by PCR NEGATIVE NEGATIVE Final     Scheduled Meds: . antiseptic oral rinse  7 mL Mouth Rinse BID  . aspirin  81 mg Oral Daily  . carvedilol  25 mg Oral BID WC  . docusate sodium  50 mg Oral BID  . heparin subcutaneous  5,000 Units Subcutaneous 3 times per day  . insulin aspart  0-20 Units Subcutaneous 6 times per day  . insulin glargine  15 Units Subcutaneous QHS  . pantoprazole (PROTONIX) IV  40 mg Intravenous Q24H  . polyethylene glycol  17 g Oral Daily  . simethicone  40 mg Oral TID  . sodium chloride  3 mL Intravenous Q12H  . ticagrelor  90 mg Oral BID   Continuous Infusions: . sodium chloride Stopped (11/06/14 0700)  . nitroGLYCERIN Stopped (11/04/14 2030)

## 2014-11-07 NOTE — Progress Notes (Signed)
Wilburt Finlay, PA notified of pt wishes, unable to contact family at this time.

## 2014-11-08 LAB — CBC WITH DIFFERENTIAL/PLATELET
BAND NEUTROPHILS: 23 % — AB (ref 0–10)
BASOS ABS: 0 10*3/uL (ref 0.0–0.1)
BLASTS: 0 %
Basophils Relative: 0 % (ref 0–1)
EOS PCT: 0 % (ref 0–5)
Eosinophils Absolute: 0 10*3/uL (ref 0.0–0.7)
HCT: 32.5 % — ABNORMAL LOW (ref 39.0–52.0)
Hemoglobin: 11.1 g/dL — ABNORMAL LOW (ref 13.0–17.0)
LYMPHS ABS: 0.4 10*3/uL — AB (ref 0.7–4.0)
Lymphocytes Relative: 5 % — ABNORMAL LOW (ref 12–46)
MCH: 31.3 pg (ref 26.0–34.0)
MCHC: 34.2 g/dL (ref 30.0–36.0)
MCV: 91.5 fL (ref 78.0–100.0)
METAMYELOCYTES PCT: 0 %
MONO ABS: 0.1 10*3/uL (ref 0.1–1.0)
MONOS PCT: 1 % — AB (ref 3–12)
Myelocytes: 0 %
Neutro Abs: 7.6 10*3/uL (ref 1.7–7.7)
Neutrophils Relative %: 71 % (ref 43–77)
Other: 0 %
PLATELETS: 118 10*3/uL — AB (ref 150–400)
Promyelocytes Absolute: 0 %
RBC: 3.55 MIL/uL — ABNORMAL LOW (ref 4.22–5.81)
RDW: 13.6 % (ref 11.5–15.5)
WBC: 8.1 10*3/uL (ref 4.0–10.5)
nRBC: 0 /100 WBC

## 2014-11-08 LAB — COMPREHENSIVE METABOLIC PANEL
ALT: 50 U/L (ref 17–63)
AST: 32 U/L (ref 15–41)
Albumin: 1.8 g/dL — ABNORMAL LOW (ref 3.5–5.0)
Alkaline Phosphatase: 41 U/L (ref 38–126)
Anion gap: 8 (ref 5–15)
BUN: 29 mg/dL — ABNORMAL HIGH (ref 6–20)
CHLORIDE: 111 mmol/L (ref 101–111)
CO2: 19 mmol/L — ABNORMAL LOW (ref 22–32)
Calcium: 5.7 mg/dL — CL (ref 8.9–10.3)
Creatinine, Ser: 0.74 mg/dL (ref 0.61–1.24)
Glucose, Bld: 150 mg/dL — ABNORMAL HIGH (ref 65–99)
POTASSIUM: 3.3 mmol/L — AB (ref 3.5–5.1)
Sodium: 138 mmol/L (ref 135–145)
Total Bilirubin: 1.5 mg/dL — ABNORMAL HIGH (ref 0.3–1.2)
Total Protein: 4.6 g/dL — ABNORMAL LOW (ref 6.5–8.1)

## 2014-11-08 LAB — MAGNESIUM: MAGNESIUM: 2 mg/dL (ref 1.7–2.4)

## 2014-11-08 LAB — HEPARIN INDUCED PLATELET AB (HIT ANTIBODY): Heparin Induced Plt Ab: 0.15 OD (ref 0.000–0.400)

## 2014-11-08 LAB — GLUCOSE, CAPILLARY
GLUCOSE-CAPILLARY: 162 mg/dL — AB (ref 65–99)
GLUCOSE-CAPILLARY: 162 mg/dL — AB (ref 65–99)
Glucose-Capillary: 162 mg/dL — ABNORMAL HIGH (ref 65–99)
Glucose-Capillary: 198 mg/dL — ABNORMAL HIGH (ref 65–99)
Glucose-Capillary: 230 mg/dL — ABNORMAL HIGH (ref 65–99)
Glucose-Capillary: 258 mg/dL — ABNORMAL HIGH (ref 65–99)
Glucose-Capillary: 222 mg/dL — ABNORMAL HIGH (ref 65–99)

## 2014-11-08 LAB — TROPONIN I
TROPONIN I: 4.68 ng/mL — AB (ref <0.031)
TROPONIN I: 4.41 ng/mL — AB (ref <0.031)

## 2014-11-08 LAB — LIPASE, BLOOD: LIPASE: 22 U/L (ref 22–51)

## 2014-11-08 MED ORDER — SODIUM CHLORIDE 0.9 % IV SOLN
1.0000 g | Freq: Once | INTRAVENOUS | Status: AC
  Administered 2014-11-08: 1 g via INTRAVENOUS
  Filled 2014-11-08: qty 10

## 2014-11-08 MED ORDER — POTASSIUM CHLORIDE CRYS ER 20 MEQ PO TBCR
40.0000 meq | EXTENDED_RELEASE_TABLET | Freq: Once | ORAL | Status: AC
  Administered 2014-11-08: 40 meq via ORAL
  Filled 2014-11-08: qty 2

## 2014-11-08 NOTE — Progress Notes (Signed)
Pt cold one minute and hot the next.  Temp is normal at 98.0.  o2 sats around 87-88 % placed on VM 40%.  Pt stated "feels better".  Lungs clear slightly dim to bases.   Family at bedside.  Will continue to monitor. [name]

## 2014-11-08 NOTE — Progress Notes (Signed)
Patient ID: Justin [name], male   DOB: Aug 01, 1960, 54 y.o.   MRN: [id number]  [facility] PROGRESS NOTE  Justin [name] WUJ:[id number] DOB: May 10, 1960 DOA: 10/30/2014 PCP: [name], MD   Brief narrative:    11M with DM, HTN, HLD, obesity and CAD s/p anterior wall MI (2011) with residual 3VD that was not amendeble to revascularization c/b LVSD (EF nadir 30-35%, improved to 45-50% in 2012) presented August 23rd, 2016 with lateral STEMI in the setting of acute pancreatitis. Admitted under PCCM and cardiology team. [facility] was consulted by cardiology team for evaluation of abd pain and pancreatitis.   Assessment/Plan:    ST elevation myocardial infarction (STEMI) of lateral wall: - s/p POBA to prox and mid LAD - further care per cardiology service - patient on ASA, COREG, ACE, Aldactone and brillinta  - plan for PCI with new stent once pt recovers from pancreatitis   Abd pain secondary to acute pancreatitis, ? Gallstone  - lipase on admission >  3000 and currently down to 22 - LFT's also elevated with [place] notable for cholelithiasis and gallbladder wall thickening - LFT's trending down but still elevated and bilirubin as high as 3.9 but now trending down to 1.5 - d/w GI on call Dr. [name], pt likely passed stone, will need referral for outpatient follow up, advised to try to schedule with Dr. [name] once acute medical issues resolved and pt will eventually need lap chole  - per GI OK to continue advancing diet as pt able to tolerate   Ileus - pulled out NGT 8/26 - OK to keep out for now - had BM this AM, continue to observe, advance diet to soft  - continue bowel regimen   Acute on chronic combined systolic and diastolic CHF - management per cardiology team - still with +1 LE edema, per cardiology avoiding lasix as possible - weight on admission 278 lbs, weight this AM 185 lbs - monitor daily weights and strict I/O  Acute renal failure - Cr trend since admission: 1.19 -->  3.12 --> 3.69 --> 1.38 --> 0.74 - monitor UOP - repeat BMP in AM  Hypokalemia - supplement and keep K ~4 - repeat BMP in AM  Hypocalcemia - will give one dose of calcium gluconate  - repeat BMP in AM  Hyponatremia - improving and WNL this AM - repeat BMP in AM  HLD - target LDL< 70 - was not on statins at home - holding statin currently given transaminitis  Diabetes Mellitus type 2 controlled  - 8/24 hemoglobin A1c = 6.4  - Increased Lantus to 15 units daily - Continue resistant SSI  Thrombocytopenia - Hit panel pending - if Plt continue to drop, may need to hold Heparin products  Morbid obesity - Body mass index is 42.17 kg/(m^2).  DVT prophylaxis - Heparin SQ  Code Status: Full.  Family Communication:  plan of care discussed with the family at bedside  Disposition Plan: Not ready for d/c at this time  IV access:  Peripheral IV  Procedures and diagnostic studies:     Ct Chest Wo Contrast 10/19/2014  Fat stranding and fluid about the pancreas compatible with acute pancreatitis. Unable to assess for necrosis without IV contrast.  Cholelithiasis without CT evidence for acute cholecystitis.     Dg Chest Port 1v Same Day 11/07/2014  Enlargement of cardiac silhouette.  Low lung volumes with bibasilar atelectasis.     Dg Abd Portable 1v 11/04/2014  Stomach dilated with air.  There is some suggestion  of mildly dilated small bowel loops.    US Abdomen Limited Ruq 11/04/2014   Cholelithiasis with mild gallbladder wall thickening.  Fatty liver.  Area of decreased attenuation adjacent to the gallbladder fossa. This may be related to an area of focal fatty sparing or small amount of pericholecystic fluid.     Medical Consultants:  [facility] consulting team GI   Other Consultants:  PT  IAnti-Infectives:   None   Debbora Presto, MD  [facility] Pager [phone]  If 7PM-7AM, please contact night-coverage [email or web address] Password [email or web address] 11/08/2014, 9:05 AM   LOS: 5 days    HPI/Subjective: No events overnight.   Objective: Filed Vitals:   11/08/14 0200 11/08/14 0335 11/08/14 0651 11/08/14 0728  BP:  125/80  124/75  Pulse: 96 90  96  Temp:  97.4 F (36.3 C)  97.4 F (36.3 C)  TempSrc:  Oral  Oral  Resp:  18  19  Height:      Weight:   129.6 kg (285 lb 11.5 oz)   SpO2: 90% 96%  95%    Intake/Output Summary (Last 24 hours) at 11/08/14 0905 Last data filed at 11/08/14 0800  Gross per 24 hour  Intake 1971.25 ml  Output   2375 ml  Net -403.75 ml    Exam:   General:  Pt is alert, follows commands appropriately, not in acute distress  Cardiovascular: Regular rhythm, tachycardic, no rubs, no gallops  Respiratory: Clear to auscultation bilaterally, no wheezing, diminished breath sounds at bases   Abdomen: Soft, non tender, slightly distended, bowel sounds faint   Extremities: +1 bilateral LE edema, pulses DP and PT palpable bilaterally  Neuro: Grossly nonfocal  Data Reviewed: Basic Metabolic Panel:  Recent Labs Lab 11/04/14 0002  11/05/14 0540 11/06/14 0516 11/06/14 1923 11/07/14 0559 11/08/14 0644  NA  --   < > 131* 130* 131* 132* 138  K  --   < > 4.4 4.8 4.1 3.6 3.3*  CL  --   < > 99* 97* 100* 103 111  CO2  --   < > 21* 22 19* 20* 19*  GLUCOSE  --   < > 214* 139* 173* 162* 150*  BUN  --   < > 42* 71* 76* 63* 29*  CREATININE  --   < > 3.12* 3.69* 2.12* 1.38* 0.74  CALCIUM  --   < > 7.1* 6.5* 6.6* 6.6* 5.7*  MG 1.8  --   --   --   --  1.9 2.0  < > = values in this interval not displayed. Liver Function Tests:  Recent Labs Lab 10/17/2014 2020 11/04/14 0245 11/05/14 0540 11/06/14 0516 11/08/14 0644  AST 155* 331* 236* 92* 32  ALT 116* 205* 250* 143* 50  ALKPHOS 65 62 52 50 41  BILITOT 2.1* 1.8* 3.9* 1.9* 1.5*  PROT 7.6 6.6 6.1* 6.6 4.6*  ALBUMIN 4.3 3.8 2.9* 2.8* 1.8*    Recent Labs Lab 10/23/2014 2020 11/04/14 0245 11/05/14 0540 11/07/14 0559 11/08/14 0644  LIPASE >3000* 936* 702* 52* 22  AMYLASE  --  1225*  --    --   --    CBC:  Recent Labs Lab 11/08/2014 2020 11/04/14 0510 11/05/14 0540 11/06/14 0516 11/07/14 0559 11/08/14 0644  WBC 20.4* 17.3* 15.0* 9.5 9.0 8.1  NEUTROABS 18.0*  --   --   --  8.2* 0.0*  HGB 16.4 17.0 15.3 13.9 12.8* PENDING  HCT 47.5 48.9 44.1 40.4 37.0* PENDING  MCV 90.3 90.6 90.4 91.0  88.9 PENDING  PLT 263 227 151 106* 110* PENDING   Cardiac Enzymes:  Recent Labs Lab 11/04/14 0510  11/06/14 1923  11/07/14 0559 11/07/14 1128 11/07/14 1655 11/07/14 2329 11/08/14 0644  CKTOTAL 3005*  --  497*  --   --   --   --   --   --   CKMB >260.0*  --   --   --   --   --   --   --   --   TROPONINI 58.64*  < > 13.76*  < > 10.15* 8.41* 8.30* 4.68* 4.41*  < > = values in this interval not displayed.  CBG:  Recent Labs Lab 11/07/14 1647 11/07/14 1950 11/07/14 2343 11/08/14 0342 11/08/14 0727  GLUCAP 155* 143* 162* 162* 162*    Recent Results (from the past 240 hour(s))  MRSA PCR Screening     Status: None   Collection Time: 28-Nov-2014 11:17 PM  Result Value Ref Range Status   MRSA by PCR NEGATIVE NEGATIVE Final     Scheduled Meds: . antiseptic oral rinse  7 mL Mouth Rinse BID  . aspirin  81 mg Oral Daily  . calcium gluconate  1 g Intravenous Once  . carvedilol  25 mg Oral BID WC  . heparin subcutaneous  5,000 Units Subcutaneous 3 times per day  . insulin aspart  0-20 Units Subcutaneous 6 times per day  . insulin glargine  15 Units Subcutaneous QHS  . pantoprazole (PROTONIX) IV  40 mg Intravenous Q24H  . polyethylene glycol  17 g Oral Daily  . potassium chloride  40 mEq Oral Once  . senna-docusate  1 tablet Oral BID  . simethicone  40 mg Oral TID  . sodium chloride  3 mL Intravenous Q12H  . ticagrelor  90 mg Oral BID   Continuous Infusions: . sodium chloride Stopped (11/06/14 0700)  . nitroGLYCERIN Stopped (11/04/14 2030)

## 2014-11-08 NOTE — Evaluation (Signed)
Physical Therapy Evaluation Patient Details Name: Justin [name] MRN: [id number] DOB: 05-16-1960 Today's Date: 11/08/2014   History of Present Illness  19M with DM, HTN, HLD, obesity and CAD s/p anterior wall MI (2011) with residual 3VD that was not amendeble to revascularization c/b LVSD (EF nadir 30-35%, improved to 45-50% in 2012) presents with lateral STEMI in the setting of pancreatitis.   Clinical Impression  Pt pleasant with rather flat affect with decreased problem solving, balance and memory but great strength. Pt with difficulty maintaining his oxygen saturations. On RA at rest 89-94% on 6L with 15' of gait sats dropped to 86% on 6L and with standing rest with pursed lip breathing unable to recover. On 8L via [place] able to maintain 90% with gait with max cues for breathing technique, slow gait and 3 standing rest breaks. Pt will benefit from acute therapy to maximize balance, mobility and independence prior to D/C to return pt to independent function.     Follow Up Recommendations Other (comment) (cardiac rehab)    Equipment Recommendations  Rolling walker with 5" wheels    Recommendations for Other Services       Precautions / Restrictions Precautions Precautions: Fall Precaution Comments: watch sats      Mobility  Bed Mobility               General bed mobility comments: in chair on arrival  Transfers Overall transfer level: Modified independent                  Ambulation/Gait Ambulation/Gait assistance: Min guard Ambulation Distance (Feet): 400 Feet Assistive device: Rolling walker (2 wheeled) Gait Pattern/deviations: Step-through pattern;Decreased stride length   Gait velocity interpretation: Below normal speed for age/gender General Gait Details: cues for safety, position in RW and breathing technique  Stairs            Wheelchair Mobility    Modified Rankin (Stroke Patients Only)       Balance Overall balance assessment: History of  Falls;Needs assistance   Sitting balance-Leahy Scale: Good       Standing balance-Leahy Scale: Fair                               Pertinent Vitals/Pain Pain Assessment: No/denies pain  HR 101 with gait BP 117/78 after activity    Home Living Family/patient expects to be discharged to:: Private residence Living Arrangements: Spouse/significant other Available Help at Discharge: Family;Available 24 hours/day Type of Home: Mobile home Home Access: Ramped entrance     Home Layout: One level Home Equipment: None      Prior Function Level of Independence: Independent               Hand Dominance        Extremity/Trunk Assessment   Upper Extremity Assessment: Overall WFL for tasks assessed           Lower Extremity Assessment: Overall WFL for tasks assessed      Cervical / Trunk Assessment: Normal  Communication   Communication: No difficulties  Cognition Arousal/Alertness: Awake/alert Behavior During Therapy: Flat affect Overall Cognitive Status: Impaired/Different from baseline Area of Impairment: Safety/judgement     Memory: Decreased short-term memory   Safety/Judgement: Decreased awareness of deficits          General Comments      Exercises        Assessment/Plan    PT Assessment Patient needs continued PT services  PT Diagnosis Difficulty walking   PT Problem List Decreased activity tolerance;Decreased knowledge of use of DME;Decreased balance;Cardiopulmonary status limiting activity  PT Treatment Interventions Gait training;Functional mobility training;DME instruction;Patient/family education;Therapeutic activities;Balance training   PT Goals (Current goals can be found in the Care Plan section) Acute Rehab PT Goals 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 PT Goal Formulation: With patient/family Time For Goal Achievement: 11/22/14 Potential to Achieve Goals: Fair    Frequency Min 3X/week   Barriers to discharge         Co-evaluation               End of Session   Activity Tolerance: Patient tolerated treatment well Patient left: in chair;with call bell/phone within reach;with chair alarm set;with family/visitor present Nurse Communication: Mobility status         Time: 1213-1240 PT Time Calculation (min) (ACUTE ONLY): 27 min   Charges:   PT Evaluation $Initial PT Evaluation Tier I: 1 Procedure     PT G CodesDelorse Lek 11/08/2014, 12:48 PM Delaney Meigs, PT [phone]

## 2014-11-08 NOTE — Progress Notes (Signed)
Justin [name] awoke confused looking for the back door and wanting to go to get a biscut.  Pulled oxygen and pulse ox off, tried to remove IV tubing from pump.  Girlfriend came to room to sit with him.  He is now sleeping in the recliner and follows girlfriends commands instead of being defiant and combative. Will continue to monitor and encourage family and girlfriends presence in room.

## 2014-11-08 NOTE — Progress Notes (Signed)
Patient ID: Ilir Mahrt, male   DOB: 05-Sep-1960, 54 y.o.   MRN: [id number]    Patient Name: Justin [name] Date of Encounter: 11/08/2014     Principal Problem:   ST elevation myocardial infarction (STEMI) of lateral wall, initial episode of care Active Problems:   Hyperlipidemia with target LDL less than 70   CAD S/P percutaneous coronary angioplasty: DES x 2 to mid-dLAD, x 1 to ostial-prox LAD   Chronic combined systolic and diastolic congestive heart failure   Acute pancreatitis   Coronary stent thrombosis   Essential hypertension   Type 2 diabetes mellitus without complication   Hypoxia   Abdominal distension   Diabetes type 2, controlled   Abdominal pain   Cholelithiasis with cholecystitis of other acuity   Acute renal failure syndrome   Thrombocytopenia    SUBJECTIVE  No chest pain or abdominal pain.   CURRENT MEDS . antiseptic oral rinse  7 mL Mouth Rinse BID  . aspirin  81 mg Oral Daily  . calcium gluconate  1 g Intravenous Once  . carvedilol  25 mg Oral BID WC  . heparin subcutaneous  5,000 Units Subcutaneous 3 times per day  . insulin aspart  0-20 Units Subcutaneous 6 times per day  . insulin glargine  15 Units Subcutaneous QHS  . pantoprazole (PROTONIX) IV  40 mg Intravenous Q24H  . polyethylene glycol  17 g Oral Daily  . potassium chloride  40 mEq Oral Once  . senna-docusate  1 tablet Oral BID  . simethicone  40 mg Oral TID  . sodium chloride  3 mL Intravenous Q12H  . ticagrelor  90 mg Oral BID    OBJECTIVE  Filed Vitals:   11/08/14 0335 11/08/14 0651 11/08/14 0728 11/08/14 0813  BP: 125/80  124/75 121/91  Pulse: 90  96 100  Temp: 97.4 F (36.3 C)  97.4 F (36.3 C)   TempSrc: Oral  Oral   Resp: 18  19   Height:      Weight:  285 lb 11.5 oz (129.6 kg)    SpO2: 96%  95% 93%    Intake/Output Summary (Last 24 hours) at 11/08/14 0940 Last data filed at 11/08/14 0800  Gross per 24 hour  Intake 1971.25 ml  Output   2375 ml  Net -403.75 ml    Filed Weights   2014/11/28 1955 28-Nov-2014 2318 11/08/14 0651  Weight: 278 lb (126.1 kg) 278 lb 7.1 oz (126.3 kg) 285 lb 11.5 oz (129.6 kg)    PHYSICAL EXAM  General: Pleasant, middle aged man, calm, NAD. Neuro: Alert and oriented X 3. Moves all extremities spontaneously. Psych: Normal affect. HEENT:  Normal  Neck: Supple without bruits,  JVD - 8 cm. Lungs:  Resp regular and unlabored, CTA. Heart: RRR no s3, s4, or murmurs. Abdomen: Soft, non-tender, non-distended, BS + x 4.  Extremities: No clubbing, cyanosis or edema. DP/PT/Radials 2+ and equal bilaterally.  Accessory Clinical Findings  CBC  Recent Labs  11/07/14 0559 11/08/14 0644  WBC 9.0 8.1  NEUTROABS 8.2* 7.6  HGB 12.8* 11.1*  HCT 37.0* 32.5*  MCV 88.9 91.5  PLT 110* 118*   Basic Metabolic Panel  Recent Labs  11/07/14 0559 11/08/14 0644  NA 132* 138  K 3.6 3.3*  CL 103 111  CO2 20* 19*  GLUCOSE 162* 150*  BUN 63* 29*  CREATININE 1.38* 0.74  CALCIUM 6.6* 5.7*  MG 1.9 2.0   Liver Function Tests  Recent Labs  11/06/14 0516 11/08/14 0865  AST 92* 32  ALT 143* 50  ALKPHOS 50 41  BILITOT 1.9* 1.5*  PROT 6.6 4.6*  ALBUMIN 2.8* 1.8*    Recent Labs  11/07/14 0559 11/08/14 0644  LIPASE 52* 22   Cardiac Enzymes  Recent Labs  11/06/14 1923  11/07/14 1655 11/07/14 2329 11/08/14 0644  CKTOTAL 497*  --   --   --   --   TROPONINI 13.76*  < > 8.30* 4.68* 4.41*  < > = values in this interval not displayed. BNP Invalid input(s): POCBNP D-Dimer No results for input(s): DDIMER in the last 72 hours. Hemoglobin A1C No results for input(s): HGBA1C in the last 72 hours. Fasting Lipid Panel No results for input(s): CHOL, HDL, LDLCALC, TRIG, CHOLHDL, LDLDIRECT in the last 72 hours. Thyroid Function Tests No results for input(s): TSH, T4TOTAL, T3FREE, THYROIDAB in the last 72 hours.  Invalid input(s): FREET3  TELE  nsr  Radiology/Studies  Ct Abdomen Pelvis Wo Contrast  08-Nov-2014    CLINICAL DATA:  Patient with history of CHF and coronary artery disease presenting with sudden onset mid abdominal pain for 6 hours.  EXAM: CT CHEST, ABDOMEN AND PELVIS WITHOUT CONTRAST  TECHNIQUE: Multidetector CT imaging of the chest, abdomen and pelvis was performed following the standard protocol without IV contrast.  COMPARISON:  None.  FINDINGS: CT CHEST FINDINGS  Mediastinum/Nodes: Normal heart size. No pericardial effusion. Dense coronary arterial vascular calcifications. No axillary, mediastinal or hilar lymphadenopathy.  Lungs/Pleura: Central airways are patent. Depending ground-glass opacities within the bilateral lower lobes most compatible with atelectasis. No large pleural effusion. No pneumothorax.  CT ABDOMEN AND PELVIS FINDINGS  Hepatobiliary: The liver is normal in size and contour. Multiple high-density stones are demonstrated within the gallbladder lumen. No gallbladder wall thickening or pericholecystic fluid. No intrahepatic or extrahepatic biliary ductal dilatation.  Pancreas: There is peripancreatic fat stranding and thin fluid demonstrated. Fluid courses along the anterior right renal fascia. Unable to evaluate for pancreatic necrosis without IV contrast.  Spleen: Unremarkable  Adrenals/Urinary Tract: The adrenal glands are normal. There is mild bilateral perinephric fat stranding. Fullness within the renal pelvis bilaterally which is nonspecific however may represent multiple bilateral parapelvic cysts. The urinary bladder is unremarkable.  Stomach/Bowel: Normal appendix. No abnormal bowel wall thickening or evidence for bowel obstruction. Probable duodenum diverticulum.  Vascular/Lymphatic: Normal caliber abdominal aorta. No retroperitoneal lymphadenopathy.  Other: Prostate unremarkable. Bilateral fat containing inguinal hernias, left-greater-than-right.  Musculoskeletal: No aggressive or acute appearing osseous lesions.  IMPRESSION: Fat stranding and fluid about the pancreas compatible  with acute pancreatitis. Unable to assess for necrosis without IV contrast.  Cholelithiasis without CT evidence for acute cholecystitis.   Electronically Signed   By: Annia Belt M.D.   On: 08-Nov-2014 20:56   Ct Chest Wo Contrast  11-08-14   CLINICAL DATA:  Patient with history of CHF and coronary artery disease presenting with sudden onset mid abdominal pain for 6 hours.  EXAM: CT CHEST, ABDOMEN AND PELVIS WITHOUT CONTRAST  TECHNIQUE: Multidetector CT imaging of the chest, abdomen and pelvis was performed following the standard protocol without IV contrast.  COMPARISON:  None.  FINDINGS: CT CHEST FINDINGS  Mediastinum/Nodes: Normal heart size. No pericardial effusion. Dense coronary arterial vascular calcifications. No axillary, mediastinal or hilar lymphadenopathy.  Lungs/Pleura: Central airways are patent. Depending ground-glass opacities within the bilateral lower lobes most compatible with atelectasis. No large pleural effusion. No pneumothorax.  CT ABDOMEN AND PELVIS FINDINGS  Hepatobiliary: The liver is normal in size and contour. Multiple  high-density stones are demonstrated within the gallbladder lumen. No gallbladder wall thickening or pericholecystic fluid. No intrahepatic or extrahepatic biliary ductal dilatation.  Pancreas: There is peripancreatic fat stranding and thin fluid demonstrated. Fluid courses along the anterior right renal fascia. Unable to evaluate for pancreatic necrosis without IV contrast.  Spleen: Unremarkable  Adrenals/Urinary Tract: The adrenal glands are normal. There is mild bilateral perinephric fat stranding. Fullness within the renal pelvis bilaterally which is nonspecific however may represent multiple bilateral parapelvic cysts. The urinary bladder is unremarkable.  Stomach/Bowel: Normal appendix. No abnormal bowel wall thickening or evidence for bowel obstruction. Probable duodenum diverticulum.  Vascular/Lymphatic: Normal caliber abdominal aorta. No retroperitoneal  lymphadenopathy.  Other: Prostate unremarkable. Bilateral fat containing inguinal hernias, left-greater-than-right.  Musculoskeletal: No aggressive or acute appearing osseous lesions.  IMPRESSION: Fat stranding and fluid about the pancreas compatible with acute pancreatitis. Unable to assess for necrosis without IV contrast.  Cholelithiasis without CT evidence for acute cholecystitis.   Electronically Signed   By: Annia Belt M.D.   On: 2014/11/05 20:56   Dg Chest Port 1 View  11/04/2014   CLINICAL DATA:  Hypoxia and shortness of breath.  EXAM: PORTABLE CHEST - 1 VIEW  COMPARISON:  Chest CT 1 day prior.  FINDINGS: Lung volumes are low leading to accentuation of the cardiac size and crowding of bronchovascular structures. Probable bilateral perihilar atelectasis. No large pleural effusion or pneumothorax. Detailed evaluation limited by soft tissue attenuation from body habitus and low lung volumes.  IMPRESSION: Hypoventilatory chest with probable perihilar atelectasis.   Electronically Signed   By: Rubye Oaks M.D.   On: 11/04/2014 02:45   Dg Chest Port 1v Same Day  11/07/2014   CLINICAL DATA:  Dyspnea, STEMI, coronary artery disease, hypertension, diabetes mellitus, acute pancreatitis  EXAM: PORTABLE CHEST - 1 VIEW SAME DAY  COMPARISON:  Portable exam 1142 hours compared 11/04/2014  FINDINGS: Enlargement of cardiac silhouette.  Mediastinal contours pulmonary vascularity grossly normal for technique.  Low lung volumes with bibasilar atelectasis.  Upper lungs clear.  No gross pleural effusion or pneumothorax.  IMPRESSION: Enlargement of cardiac silhouette.  Low lung volumes with bibasilar atelectasis.   Electronically Signed   By: Ulyses Southward M.D.   On: 11/07/2014 12:56   Dg Abd Portable 1v  11/04/2014   CLINICAL DATA:  Abdominal pain and distension, recent vomiting  EXAM: PORTABLE ABDOMEN - 1 VIEW  COMPARISON:  None.  FINDINGS: Stomach is distended with air. Scattered large and small bowel gas is noted.  There is some suggestion of mildly dilated small bowel loops although incompletely evaluated on this supine study.  IMPRESSION: Stomach dilated with air.  There is some suggestion of mildly dilated small bowel loops.   Electronically Signed   By: Alcide Clever M.D.   On: 11/04/2014 12:13   US Abdomen Limited Ruq  11/04/2014   CLINICAL DATA:  Upper abdominal pain and pancreatitis  EXAM: US ABDOMEN LIMITED - RIGHT UPPER QUADRANT  COMPARISON:  2014/11/05  FINDINGS: Gallbladder:  Multiple gallstones are noted. Mild wall thickening at 3.9 mm is noted. Some of this may be reactive in nature given the known pancreatitis.  Common bile duct:  Diameter: 7 mm  Liver:  Diffuse increased echogenicity is noted likely related to fatty infiltration. A small focus of decreased echogenicity is noted adjacent to the gallbladder which may be a small amount of pericholecystic fluid or focal fatty sparing.  IMPRESSION: Cholelithiasis with mild gallbladder wall thickening.  Fatty liver.  Area of  decreased attenuation adjacent to the gallbladder fossa. This may be related to an area of focal fatty sparing or small amount of pericholecystic fluid.   Electronically Signed   By: Alcide Clever M.D.   On: 11/04/2014 07:59    ASSESSMENT AND PLAN  1. Hypocalcemia, - he is s/p repletion. ? Fat saponification. Will follow. Asymptomatic 2. Acute lateral MI - s/p POBA. Stable currently 3. Pancreatitis - he is tolerating po liquids with no nausea or vomiting 4. Mental status - appears improved with less agitation on exam this morning.   Karyn Brull,M.D.  11/08/2014 9:40 AM

## 2014-11-08 NOTE — Progress Notes (Signed)
CRITICAL VALUE ALERT  Critical value received:  Ca 5.7  Date of notification:  11/08/14  Time of notification:  0826  Critical value read back:Yes.    Nurse who received alert:  Melonie Florida  MD notified (1st page):  Dr Izola Price  Time of first page:  [phone] (text page with info)

## 2014-11-09 DIAGNOSIS — Z9861 Coronary angioplasty status: Secondary | ICD-10-CM

## 2014-11-09 DIAGNOSIS — T82857D Stenosis of cardiac prosthetic devices, implants and grafts, subsequent encounter: Secondary | ICD-10-CM

## 2014-11-09 DIAGNOSIS — R140 Abdominal distension (gaseous): Secondary | ICD-10-CM

## 2014-11-09 LAB — CBC WITH DIFFERENTIAL/PLATELET
BASOS PCT: 0 % (ref 0–1)
Band Neutrophils: 17 % — ABNORMAL HIGH (ref 0–10)
Basophils Absolute: 0 10*3/uL (ref 0.0–0.1)
EOS ABS: 0 10*3/uL (ref 0.0–0.7)
EOS PCT: 0 % (ref 0–5)
HCT: 41 % (ref 39.0–52.0)
Hemoglobin: 13.7 g/dL (ref 13.0–17.0)
LYMPHS PCT: 3 % — AB (ref 12–46)
Lymphs Abs: 0.3 10*3/uL — ABNORMAL LOW (ref 0.7–4.0)
MCH: 30.9 pg (ref 26.0–34.0)
MCHC: 33.4 g/dL (ref 30.0–36.0)
MCV: 92.6 fL (ref 78.0–100.0)
MONO ABS: 0.2 10*3/uL (ref 0.1–1.0)
Metamyelocytes Relative: 1 %
Monocytes Relative: 2 % — ABNORMAL LOW (ref 3–12)
Myelocytes: 1 %
NEUTROS PCT: 76 % (ref 43–77)
Neutro Abs: 9.2 10*3/uL — ABNORMAL HIGH (ref 1.7–7.7)
PLATELETS: 151 10*3/uL (ref 150–400)
RBC: 4.43 MIL/uL (ref 4.22–5.81)
RDW: 14 % (ref 11.5–15.5)
WBC: 9.7 10*3/uL (ref 4.0–10.5)

## 2014-11-09 LAB — GLUCOSE, CAPILLARY
GLUCOSE-CAPILLARY: 204 mg/dL — AB (ref 65–99)
GLUCOSE-CAPILLARY: 222 mg/dL — AB (ref 65–99)
Glucose-Capillary: 145 mg/dL — ABNORMAL HIGH (ref 65–99)
Glucose-Capillary: 255 mg/dL — ABNORMAL HIGH (ref 65–99)
Glucose-Capillary: 295 mg/dL — ABNORMAL HIGH (ref 65–99)

## 2014-11-09 LAB — COMPREHENSIVE METABOLIC PANEL
ALBUMIN: 2.2 g/dL — AB (ref 3.5–5.0)
ALT: 50 U/L (ref 17–63)
ANION GAP: 5 (ref 5–15)
AST: 39 U/L (ref 15–41)
Alkaline Phosphatase: 59 U/L (ref 38–126)
BILIRUBIN TOTAL: 1.6 mg/dL — AB (ref 0.3–1.2)
BUN: 24 mg/dL — ABNORMAL HIGH (ref 6–20)
CO2: 27 mmol/L (ref 22–32)
Calcium: 7.8 mg/dL — ABNORMAL LOW (ref 8.9–10.3)
Chloride: 103 mmol/L (ref 101–111)
Creatinine, Ser: 1.01 mg/dL (ref 0.61–1.24)
GFR calc non Af Amer: >60 mL/min (ref >60)
GLUCOSE: 189 mg/dL — AB (ref 65–99)
POTASSIUM: 4.9 mmol/L (ref 3.5–5.1)
SODIUM: 135 mmol/L (ref 135–145)
TOTAL PROTEIN: 6 g/dL — AB (ref 6.5–8.1)

## 2014-11-09 LAB — MAGNESIUM: Magnesium: 2.6 mg/dL — ABNORMAL HIGH (ref 1.7–2.4)

## 2014-11-09 LAB — LIPASE, BLOOD: Lipase: 27 U/L (ref 22–51)

## 2014-11-09 MED ORDER — ACETAMINOPHEN 500 MG PO TABS
500.0000 mg | ORAL_TABLET | ORAL | Status: DC | PRN
  Administered 2014-11-16: 500 mg via ORAL
  Filled 2014-11-09: qty 1

## 2014-11-09 MED ORDER — ISOSORBIDE MONONITRATE ER 30 MG PO TB24
30.0000 mg | ORAL_TABLET | Freq: Every day | ORAL | Status: DC
  Administered 2014-11-09 – 2014-11-14 (×5): 30 mg via ORAL
  Filled 2014-11-09 (×6): qty 1

## 2014-11-09 MED ORDER — INSULIN GLARGINE 100 UNIT/ML ~~LOC~~ SOLN
22.0000 [IU] | Freq: Every day | SUBCUTANEOUS | Status: DC
  Administered 2014-11-09: 22 [IU] via SUBCUTANEOUS
  Filled 2014-11-09 (×2): qty 0.22

## 2014-11-09 MED ORDER — INSULIN ASPART 100 UNIT/ML ~~LOC~~ SOLN
0.0000 [IU] | Freq: Three times a day (TID) | SUBCUTANEOUS | Status: DC
  Administered 2014-11-09 – 2014-11-10 (×2): 11 [IU] via SUBCUTANEOUS
  Administered 2014-11-10: 15 [IU] via SUBCUTANEOUS
  Administered 2014-11-10 – 2014-11-11 (×3): 7 [IU] via SUBCUTANEOUS
  Administered 2014-11-11: 15 [IU] via SUBCUTANEOUS
  Administered 2014-11-12: 11 [IU] via SUBCUTANEOUS
  Administered 2014-11-12: 15 [IU] via SUBCUTANEOUS
  Administered 2014-11-12 – 2014-11-13 (×2): 7 [IU] via SUBCUTANEOUS
  Administered 2014-11-13: 11 [IU] via SUBCUTANEOUS
  Administered 2014-11-13 – 2014-11-15 (×7): 7 [IU] via SUBCUTANEOUS

## 2014-11-09 MED ORDER — FUROSEMIDE 10 MG/ML IJ SOLN
40.0000 mg | Freq: Once | INTRAMUSCULAR | Status: AC
  Administered 2014-11-09: 40 mg via INTRAVENOUS
  Filled 2014-11-09: qty 4

## 2014-11-09 NOTE — Progress Notes (Signed)
CARDIAC REHAB PHASE I   PRE:  Rate/Rhythm: 94 ST  BP:  Sitting: 103/67        SaO2: 93 6L  MODE:  Ambulation: 350 ft   POST:  Rate/Rhythm: 106 ST   BP:  Sitting: 133/71         SaO2: 96 6L  Pt ambulated 350 ft on 6L O2, rolling walker, assist x 2, steady gait, tolerated well. Pt denies CP, dizziness, DOE, declined rest stop. Pt did c/o sore throat from NG tube. Pt sats 93% on 6L during ambulation, HR low 100s. Pt quiet today, but his color is better and he was less short of breath during walk. Pt to recliner after walk, feet elevated, call bell within reach, wife at bedside. Will follow. Can be assist x1.   [id number]    Joylene Grapes, RN, BSN 11/09/2014 11:48 AM

## 2014-11-09 NOTE — Progress Notes (Addendum)
Patient: Justin [name] / Admit Date: 12/01/14 / Date of Encounter: 11/09/2014, 10:49 AM   Subjective: He reports he is feeling pretty good. No abdominal pain. No CP or SOB. Still requiring O2 -> now off venti mask onto [place]. Denies any h/o lung issues.   Objective: Telemetry: NSR/ST, occasional PVCs Physical Exam: Blood pressure 119/72, pulse 99, temperature 98.3 F (36.8 C), temperature source Oral, resp. rate 18, height 5\' 9"  (1.753 m), weight 287 lb 0.6 oz (130.2 kg), SpO2 94 %. General: Well developed obese WM in no acute distress Head: Normocephalic, atraumatic, sclera non-icteric, no xanthomas, nares are without discharge. Neck: JVP not elevated. Lungs: Absent at bases, diminished until 2/3 way up bilaterally. No without wheezes, rales, or rhonchi. Breathing is unlabored. Heart: RRR borderline tachycardic S1 S2 without murmurs, rubs, or gallops.  Abdomen: Soft, non-tender, non-distended with normoactive bowel sounds. No rebound/guarding. Extremities: No clubbing or cyanosis. Trace pedal edema. Distal pedal pulses are 2+ and equal bilaterally. Neuro: Alert and oriented X 3. Moves all extremities spontaneously. Psych:  Responds to questions appropriately with a normal affect.   Intake/Output Summary (Last 24 hours) at 11/09/14 1049 Last data filed at 11/09/14 0908  Gross per 24 hour  Intake    843 ml  Output    800 ml  Net     43 ml    Inpatient Medications:  . antiseptic oral rinse  7 mL Mouth Rinse BID  . aspirin  81 mg Oral Daily  . carvedilol  25 mg Oral BID WC  . heparin subcutaneous  5,000 Units Subcutaneous 3 times per day  . insulin aspart  0-20 Units Subcutaneous 6 times per day  . insulin glargine  15 Units Subcutaneous QHS  . pantoprazole (PROTONIX) IV  40 mg Intravenous Q24H  . polyethylene glycol  17 g Oral Daily  . senna-docusate  1 tablet Oral BID  . simethicone  40 mg Oral TID  . sodium chloride  3 mL Intravenous Q12H  . ticagrelor  90 mg Oral BID    Infusions:  . sodium chloride Stopped (11/06/14 0700)  . nitroGLYCERIN Stopped (11/04/14 2030)    Labs:  Recent Labs  11/08/14 0644 11/09/14 0234  NA 138 135  K 3.3* 4.9  CL 111 103  CO2 19* 27  GLUCOSE 150* 189*  BUN 29* 24*  CREATININE 0.74 1.01  CALCIUM 5.7* 7.8*  MG 2.0 2.6*    Recent Labs  11/08/14 0644 11/09/14 0234  AST 32 39  ALT 50 50  ALKPHOS 41 59  BILITOT 1.5* 1.6*  PROT 4.6* 6.0*  ALBUMIN 1.8* 2.2*    Recent Labs  11/08/14 0644 11/09/14 0234  WBC 8.1 9.7  NEUTROABS 7.6 9.2*  HGB 11.1* 13.7  HCT 32.5* 41.0  MCV 91.5 92.6  PLT 118* 151    Recent Labs  11/06/14 1923  11/07/14 1128 11/07/14 1655 11/07/14 2329 11/08/14 0644  CKTOTAL 497*  --   --   --   --   --   TROPONINI 13.76*  < > 8.41* 8.30* 4.68* 4.41*  < > = values in this interval not displayed. Invalid input(s): POCBNP No results for input(s): HGBA1C in the last 72 hours.   Radiology/Studies:  Ct Abdomen Pelvis Wo Contrast  2014/12/01   CLINICAL DATA:  Patient with history of CHF and coronary artery disease presenting with sudden onset mid abdominal pain for 6 hours.  EXAM: CT CHEST, ABDOMEN AND PELVIS WITHOUT CONTRAST  TECHNIQUE: Multidetector CT imaging of the  chest, abdomen and pelvis was performed following the standard protocol without IV contrast.  COMPARISON:  None.  FINDINGS: CT CHEST FINDINGS  Mediastinum/Nodes: Normal heart size. No pericardial effusion. Dense coronary arterial vascular calcifications. No axillary, mediastinal or hilar lymphadenopathy.  Lungs/Pleura: Central airways are patent. Depending ground-glass opacities within the bilateral lower lobes most compatible with atelectasis. No large pleural effusion. No pneumothorax.  CT ABDOMEN AND PELVIS FINDINGS  Hepatobiliary: The liver is normal in size and contour. Multiple high-density stones are demonstrated within the gallbladder lumen. No gallbladder wall thickening or pericholecystic fluid. No intrahepatic or  extrahepatic biliary ductal dilatation.  Pancreas: There is peripancreatic fat stranding and thin fluid demonstrated. Fluid courses along the anterior right renal fascia. Unable to evaluate for pancreatic necrosis without IV contrast.  Spleen: Unremarkable  Adrenals/Urinary Tract: The adrenal glands are normal. There is mild bilateral perinephric fat stranding. Fullness within the renal pelvis bilaterally which is nonspecific however may represent multiple bilateral parapelvic cysts. The urinary bladder is unremarkable.  Stomach/Bowel: Normal appendix. No abnormal bowel wall thickening or evidence for bowel obstruction. Probable duodenum diverticulum.  Vascular/Lymphatic: Normal caliber abdominal aorta. No retroperitoneal lymphadenopathy.  Other: Prostate unremarkable. Bilateral fat containing inguinal hernias, left-greater-than-right.  Musculoskeletal: No aggressive or acute appearing osseous lesions.  IMPRESSION: Fat stranding and fluid about the pancreas compatible with acute pancreatitis. Unable to assess for necrosis without IV contrast.  Cholelithiasis without CT evidence for acute cholecystitis.   Electronically Signed   By: Annia Belt M.D.   On: 10/15/2014 20:56   Ct Chest Wo Contrast  10/29/2014   CLINICAL DATA:  Patient with history of CHF and coronary artery disease presenting with sudden onset mid abdominal pain for 6 hours.  EXAM: CT CHEST, ABDOMEN AND PELVIS WITHOUT CONTRAST  TECHNIQUE: Multidetector CT imaging of the chest, abdomen and pelvis was performed following the standard protocol without IV contrast.  COMPARISON:  None.  FINDINGS: CT CHEST FINDINGS  Mediastinum/Nodes: Normal heart size. No pericardial effusion. Dense coronary arterial vascular calcifications. No axillary, mediastinal or hilar lymphadenopathy.  Lungs/Pleura: Central airways are patent. Depending ground-glass opacities within the bilateral lower lobes most compatible with atelectasis. No large pleural effusion. No  pneumothorax.  CT ABDOMEN AND PELVIS FINDINGS  Hepatobiliary: The liver is normal in size and contour. Multiple high-density stones are demonstrated within the gallbladder lumen. No gallbladder wall thickening or pericholecystic fluid. No intrahepatic or extrahepatic biliary ductal dilatation.  Pancreas: There is peripancreatic fat stranding and thin fluid demonstrated. Fluid courses along the anterior right renal fascia. Unable to evaluate for pancreatic necrosis without IV contrast.  Spleen: Unremarkable  Adrenals/Urinary Tract: The adrenal glands are normal. There is mild bilateral perinephric fat stranding. Fullness within the renal pelvis bilaterally which is nonspecific however may represent multiple bilateral parapelvic cysts. The urinary bladder is unremarkable.  Stomach/Bowel: Normal appendix. No abnormal bowel wall thickening or evidence for bowel obstruction. Probable duodenum diverticulum.  Vascular/Lymphatic: Normal caliber abdominal aorta. No retroperitoneal lymphadenopathy.  Other: Prostate unremarkable. Bilateral fat containing inguinal hernias, left-greater-than-right.  Musculoskeletal: No aggressive or acute appearing osseous lesions.  IMPRESSION: Fat stranding and fluid about the pancreas compatible with acute pancreatitis. Unable to assess for necrosis without IV contrast.  Cholelithiasis without CT evidence for acute cholecystitis.   Electronically Signed   By: Annia Belt M.D.   On: 11/06/2014 20:56   Dg Chest Port 1 View  11/04/2014   CLINICAL DATA:  Hypoxia and shortness of breath.  EXAM: PORTABLE CHEST - 1 VIEW  COMPARISON:  Chest CT 1 day prior.  FINDINGS: Lung volumes are low leading to accentuation of the cardiac size and crowding of bronchovascular structures. Probable bilateral perihilar atelectasis. No large pleural effusion or pneumothorax. Detailed evaluation limited by soft tissue attenuation from body habitus and low lung volumes.  IMPRESSION: Hypoventilatory chest with probable  perihilar atelectasis.   Electronically Signed   By: Rubye Oaks M.D.   On: 11/04/2014 02:45   Dg Chest Port 1v Same Day  11/07/2014   CLINICAL DATA:  Dyspnea, STEMI, coronary artery disease, hypertension, diabetes mellitus, acute pancreatitis  EXAM: PORTABLE CHEST - 1 VIEW SAME DAY  COMPARISON:  Portable exam 1142 hours compared 11/04/2014  FINDINGS: Enlargement of cardiac silhouette.  Mediastinal contours pulmonary vascularity grossly normal for technique.  Low lung volumes with bibasilar atelectasis.  Upper lungs clear.  No gross pleural effusion or pneumothorax.  IMPRESSION: Enlargement of cardiac silhouette.  Low lung volumes with bibasilar atelectasis.   Electronically Signed   By: Ulyses Southward M.D.   On: 11/07/2014 12:56   Dg Abd Portable 1v  11/04/2014   CLINICAL DATA:  Abdominal pain and distension, recent vomiting  EXAM: PORTABLE ABDOMEN - 1 VIEW  COMPARISON:  None.  FINDINGS: Stomach is distended with air. Scattered large and small bowel gas is noted. There is some suggestion of mildly dilated small bowel loops although incompletely evaluated on this supine study.  IMPRESSION: Stomach dilated with air.  There is some suggestion of mildly dilated small bowel loops.   Electronically Signed   By: Alcide Clever M.D.   On: 11/04/2014 12:13   US Abdomen Limited Ruq  11/04/2014   CLINICAL DATA:  Upper abdominal pain and pancreatitis  EXAM: US ABDOMEN LIMITED - RIGHT UPPER QUADRANT  COMPARISON:  11-21-2014  FINDINGS: Gallbladder:  Multiple gallstones are noted. Mild wall thickening at 3.9 mm is noted. Some of this may be reactive in nature given the known pancreatitis.  Common bile duct:  Diameter: 7 mm  Liver:  Diffuse increased echogenicity is noted likely related to fatty infiltration. A small focus of decreased echogenicity is noted adjacent to the gallbladder which may be a small amount of pericholecystic fluid or focal fatty sparing.  IMPRESSION: Cholelithiasis with mild gallbladder wall  thickening.  Fatty liver.  Area of decreased attenuation adjacent to the gallbladder fossa. This may be related to an area of focal fatty sparing or small amount of pericholecystic fluid.   Electronically Signed   By: Alcide Clever M.D.   On: 11/04/2014 07:59     Assessment and Plan  24M with DM, HTN, HLD, obesity and CAD s/p anterior wall MI (2011) with residual 3VD that was not amendeble to revascularization c/b chronic systolic CHF (EF nadir 30-35%, improved to 45-50% in 2012) presents with lateral STEMI in the setting of pancreatitis.   1. CAD with lateral STEMI s/p POBA of prox and mid LAD  - initial cath: had lots of thrombus within the stents. Dr. Herbie Baltimore was not able to open the distal LAD - it was too diffusely diseased. POBA only performed due to need to be NPO given pancreatitis.  - continue ASA, BB, Brilinta. No statin given hepatobiliary issues right now to avoid complicating the picture but can add at discharge - per Dr. Elissa Hefty cath note, "He will likely require PCI with a new stent in the intervening segment between the ostial and mid LAD stents. However this can be performed once he has recovered from his pancreatitis." Will discuss with MD  given persistent low O2 sat  2. Acute pancreatitis complicated by ileus, hypocalcemia - lipase on admission > 3000 and currently down to 22 - LFT's also elevated with [place] notable for cholelithiasis and gallbladder wall thickening - IM d/w GI on call Dr. [name], pt likely passed stone, will need referral for outpatient follow up, will need to try to schedule with Dr. [name] once acute medical issues resolved and pt will eventually need lap chole  - patient pulled out NGT 8/26 - repletion of calcium per IM  3. Persistent hypoxia  - high O2 requirements ever since admission with borderline sinus tach - will discuss further management with Dr. Tresa Endo - ? Initiate IV Lasix. LVEDP 30-36 at time of cath 10/29/2014, but has not been on diuretics  recently due to AKI  4. Acute on chronic combined CHF - EF 20-25% by echo 11/06/14 - continue BB - holding off ACEI with recent Cr elevation, but may be nearing a point where we can add soon - ?consider LifeVest - see above  5. Acute kidney injury - peak Cr 3.6 - improved  6. Hypertension, controlled 7. Hyperlipidemia, see above 8. Intermittent delirium, maybe due to toxic metabolic encephalopathy 9. Diabetes mellitus - per IM  Signed, Ronie Spies PA-C Pager: [phone]  Patient seen and examined. Agree with assessment and plan. Will start Lasix 40 mg iv today to initiate improved diuresis; add low dose nitrates at 30 mg with significant concomitant CAD. Follow renal fxn and if stable add low dose ACE-I or ARB tomorrow. Advance diet as tolerates. Consider outpatient eval for OSA. F/u ECG tomorrow.   Lennette Bihari, MD, [facility] 11/09/2014 11:48 AM

## 2014-11-09 NOTE — Consult Note (Signed)
[facility] TEAM 1 - Stepdown/ICU TEAM CONSULT F/U NOTE  Justin [name] NFA:[id number] DOB: Nov 15, 1960 DOA: 10/21/2014 PCP: [name], MD  Admit HPI / Brief Narrative: 51M with DM, HTN, HLD, obesity and CAD s/p anterior wall MI (2011) with residual 3VD that was not amendeble to revascularization c/b LVSD (EF nadir 30-35%, improved to 45-50% in 2012) presented August 23rd, 2016 with lateral STEMI in the setting of acute pancreatitis. Admitted under PCCM and cardiology team. TRh was consulted by cardiology team for evaluation of abd pain and pancreatitis.   HPI/Subjective: The patient is resting comfortably in a bedside chair at the time of visit.  He is mildly tachypneic but states he is not having trouble breathing.  He denies chest pain nausea vomiting or abdominal pain.  He is tolerating his current diet without difficulty per his report.  Recommendations/Plan:  CAD - ST elevation myocardial infarction (STEMI) of lateral wall - as per primary service (Cardiology)  Acute suspected gallstone pancreatitis - transaminitis  - lipase on admission > 3000 but has since normalized - LFT were elevated with [place] notable for cholelithiasis and gallbladder wall thickening - d/w GI on call Dr. [name], pt likely passed stone, will need referral for outpatient follow up, advised to try to schedule with Dr. [name] once acute medical issues resolved - pt will eventually need lap chole  - per GI OK to continue advancing diet as pt able to tolerate  - LFTs have now normalized   Ileus - pulled out NGT 8/26 - tolerating current diet w/o difficulty  - continue bowel regimen   Acute on chronic combined systolic and diastolic CHF - management per Cardiology - EF 20-25% by echo 11/06/14 - weight on admission 278 lbs, weight this AM 287 lbs  Acute hypoxic respiratory failure  - appears to be due to pulmonary edema - follow w/ diuresis   Acute renal failure - Cr peaked at 3.69 but has now  normalized - follow w/ diuresis   Hypokalemia - Resolved with supplementation  Hypocalcemia - Calcium currently corrects to normal range - further supplementation not presently indicated  Hyponatremia - Resolved  HLD - target LDL< 70 - was not on statins at home - holding statin currently given recent transaminitis and pancreatitis  Diabetes Mellitus 2 - 8/24 A1c 6.4  - Adjust insulin further as CBG currently trending upward  Thrombocytopenia - HIT panel pending - PLT count improving   Morbid obesity - Body mass index is 42.17 kg/(m^2).  Code Status: FULL Family Communication: no family present at time of exam  Antibiotics: none  DVT prophylaxis: SQ heparin   Objective: Blood pressure 113/71, pulse 94, temperature 98.3 F (36.8 C), temperature source Oral, resp. rate 18, height 5\' 9"  (1.753 m), weight 130.2 kg (287 lb 0.6 oz), SpO2 96 %.  Intake/Output Summary (Last 24 hours) at 11/09/14 1407 Last data filed at 11/09/14 1300  Gross per 24 hour  Intake   1203 ml  Output   1350 ml  Net   -147 ml   Exam: General: mild tachypnea  Lungs: Fine crackles throughout and more prominent in bilateral bases Cardiovascular: Regular rate and rhythm without murmur gallop or rub  Abdomen: Nontender, protuberant/obese, soft, bowel sounds positive, no rebound, no ascites, no appreciable mass Extremities: No significant cyanosis, clubbing;  2+ edema bilateral lower extremities  Data Reviewed: Basic Metabolic Panel:  Recent Labs Lab 11/04/14 0002  11/06/14 0516 11/06/14 1923 11/07/14 0559 11/08/14 0644 11/09/14 0234  NA  --   < >  130* 131* 132* 138 135  K  --   < > 4.8 4.1 3.6 3.3* 4.9  CL  --   < > 97* 100* 103 111 103  CO2  --   < > 22 19* 20* 19* 27  GLUCOSE  --   < > 139* 173* 162* 150* 189*  BUN  --   < > 71* 76* 63* 29* 24*  CREATININE  --   < > 3.69* 2.12* 1.38* 0.74 1.01  CALCIUM  --   < > 6.5* 6.6* 6.6* 5.7* 7.8*  MG 1.8  --   --   --  1.9 2.0 2.6*  <  > = values in this interval not displayed.  CBC:  Recent Labs Lab 11/05/2014 2020  11/05/14 0540 11/06/14 0516 11/07/14 0559 11/08/14 0644 11/09/14 0234  WBC 20.4*  < > 15.0* 9.5 9.0 8.1 9.7  NEUTROABS 18.0*  --   --   --  8.2* 7.6 9.2*  HGB 16.4  < > 15.3 13.9 12.8* 11.1* 13.7  HCT 47.5  < > 44.1 40.4 37.0* 32.5* 41.0  MCV 90.3  < > 90.4 91.0 88.9 91.5 92.6  PLT 263  < > 151 106* 110* 118* 151  < > = values in this interval not displayed.  Liver Function Tests:  Recent Labs Lab 11/04/14 0245 11/05/14 0540 11/06/14 0516 11/08/14 0644 11/09/14 0234  AST 331* 236* 92* 32 39  ALT 205* 250* 143* 50 50  ALKPHOS 62 52 50 41 59  BILITOT 1.8* 3.9* 1.9* 1.5* 1.6*  PROT 6.6 6.1* 6.6 4.6* 6.0*  ALBUMIN 3.8 2.9* 2.8* 1.8* 2.2*    Recent Labs Lab 11/04/14 0245 11/05/14 0540 11/07/14 0559 11/08/14 0644 11/09/14 0234  LIPASE 936* 702* 52* 22 27  AMYLASE 1225*  --   --   --   --    Coags:  Recent Labs Lab 10/18/2014 2020  INR 0.96    Recent Labs Lab 10/28/2014 2020  APTT 23*    Cardiac Enzymes:  Recent Labs Lab 11/04/14 0510  11/06/14 1923  11/07/14 0559 11/07/14 1128 11/07/14 1655 11/07/14 2329 11/08/14 0644  CKTOTAL 3005*  --  497*  --   --   --   --   --   --   CKMB >260.0*  --   --   --   --   --   --   --   --   TROPONINI 58.64*  < > 13.76*  < > 10.15* 8.41* 8.30* 4.68* 4.41*  < > = values in this interval not displayed.  CBG:  Recent Labs Lab 11/08/14 2009 11/08/14 2326 11/09/14 0350 11/09/14 0731 11/09/14 1208  GLUCAP 258* 222* 145* 204* 255*    Recent Results (from the past 240 hour(s))  MRSA PCR Screening     Status: None   Collection Time: 11/09/2014 11:17 PM  Result Value Ref Range Status   MRSA by PCR NEGATIVE NEGATIVE Final    Comment:        The GeneXpert MRSA Assay (FDA approved for NASAL specimens only), is one component of a comprehensive MRSA colonization surveillance program. It is not intended to diagnose MRSA infection  nor to guide or monitor treatment for MRSA infections.      Studies:   Recent x-ray studies have been reviewed in detail by the Attending Physician  Scheduled Meds:  Scheduled Meds: . antiseptic oral rinse  7 mL Mouth Rinse BID  . aspirin  81 mg Oral  Daily  . carvedilol  25 mg Oral BID WC  . heparin subcutaneous  5,000 Units Subcutaneous 3 times per day  . insulin aspart  0-20 Units Subcutaneous 6 times per day  . insulin glargine  15 Units Subcutaneous QHS  . isosorbide mononitrate  30 mg Oral Daily  . pantoprazole (PROTONIX) IV  40 mg Intravenous Q24H  . polyethylene glycol  17 g Oral Daily  . senna-docusate  1 tablet Oral BID  . simethicone  40 mg Oral TID  . sodium chloride  3 mL Intravenous Q12H  . ticagrelor  90 mg Oral BID    Time spent on care of this patient: 35 mins   Dorothea Yow T , MD   [facility] Office  [phone] Pager - Text Page per Loretha Stapler as per below:  On-Call/Text Page:      [email or web address]      password [email or web address]  If 7PM-7AM, please contact night-coverage [email or web address] Password [email or web address] 11/09/2014, 2:07 PM   LOS: 6 days

## 2014-11-09 NOTE — Progress Notes (Signed)
Inpatient Diabetes Program Recommendations  AACE/ADA: New Consensus Statement on Inpatient Glycemic Control (2013)  Target Ranges:  Prepandial:   less than 140 mg/dL      Peak postprandial:   less than 180 mg/dL (1-2 hours)      Critically ill patients:  140 - 180 mg/dL   Inpatient Diabetes Program Recommendations Insulin - Basal: increase Lantus to 20 units  Correction (SSI): change correction to TID + HS scale per Glycemic Control order-set since pt is now eating  Thank you  Piedad Climes BSN, RN,CDE Inpatient Diabetes Coordinator [phone] (team pager)

## 2014-11-10 DIAGNOSIS — K567 Ileus, unspecified: Secondary | ICD-10-CM | POA: Diagnosis present

## 2014-11-10 DIAGNOSIS — K851 Biliary acute pancreatitis without necrosis or infection: Secondary | ICD-10-CM | POA: Diagnosis present

## 2014-11-10 DIAGNOSIS — I5042 Chronic combined systolic (congestive) and diastolic (congestive) heart failure: Secondary | ICD-10-CM | POA: Diagnosis present

## 2014-11-10 DIAGNOSIS — K8020 Calculus of gallbladder without cholecystitis without obstruction: Secondary | ICD-10-CM

## 2014-11-10 DIAGNOSIS — E785 Hyperlipidemia, unspecified: Secondary | ICD-10-CM | POA: Diagnosis present

## 2014-11-10 LAB — COMPREHENSIVE METABOLIC PANEL
ALT: 38 U/L (ref 17–63)
AST: 39 U/L (ref 15–41)
Albumin: 2 g/dL — ABNORMAL LOW (ref 3.5–5.0)
Alkaline Phosphatase: 69 U/L (ref 38–126)
Anion gap: 7 (ref 5–15)
BUN: 25 mg/dL — ABNORMAL HIGH (ref 6–20)
CO2: 26 mmol/L (ref 22–32)
Calcium: 7.8 mg/dL — ABNORMAL LOW (ref 8.9–10.3)
Chloride: 98 mmol/L — ABNORMAL LOW (ref 101–111)
Creatinine, Ser: 1.02 mg/dL (ref 0.61–1.24)
GFR calc Af Amer: >60 mL/min (ref >60)
GFR calc non Af Amer: >60 mL/min (ref >60)
Glucose, Bld: 245 mg/dL — ABNORMAL HIGH (ref 65–99)
Potassium: 4.6 mmol/L (ref 3.5–5.1)
Sodium: 131 mmol/L — ABNORMAL LOW (ref 135–145)
Total Bilirubin: 1.6 mg/dL — ABNORMAL HIGH (ref 0.3–1.2)
Total Protein: 5.8 g/dL — ABNORMAL LOW (ref 6.5–8.1)

## 2014-11-10 LAB — GLUCOSE, CAPILLARY
GLUCOSE-CAPILLARY: 301 mg/dL — AB (ref 65–99)
Glucose-Capillary: 215 mg/dL — ABNORMAL HIGH (ref 65–99)
Glucose-Capillary: 252 mg/dL — ABNORMAL HIGH (ref 65–99)
Glucose-Capillary: 276 mg/dL — ABNORMAL HIGH (ref 65–99)

## 2014-11-10 LAB — CBC WITH DIFFERENTIAL/PLATELET
Basophils Absolute: 0.2 10*3/uL — ABNORMAL HIGH (ref 0.0–0.1)
Basophils Relative: 1 % (ref 0–1)
Eosinophils Absolute: 0.2 10*3/uL (ref 0.0–0.7)
Eosinophils Relative: 1 % (ref 0–5)
HCT: 41.8 % (ref 39.0–52.0)
Hemoglobin: 14.3 g/dL (ref 13.0–17.0)
Lymphocytes Relative: 8 % — ABNORMAL LOW (ref 12–46)
Lymphs Abs: 1.2 10*3/uL (ref 0.7–4.0)
MCH: 31.6 pg (ref 26.0–34.0)
MCHC: 34.2 g/dL (ref 30.0–36.0)
MCV: 92.3 fL (ref 78.0–100.0)
Monocytes Absolute: 1.5 10*3/uL — ABNORMAL HIGH (ref 0.1–1.0)
Monocytes Relative: 10 % (ref 3–12)
Neutro Abs: 12.2 10*3/uL — ABNORMAL HIGH (ref 1.7–7.7)
Neutrophils Relative %: 80 % — ABNORMAL HIGH (ref 43–77)
Platelets: 150 10*3/uL (ref 150–400)
RBC: 4.53 MIL/uL (ref 4.22–5.81)
RDW: 14 % (ref 11.5–15.5)
WBC: 15.3 10*3/uL — ABNORMAL HIGH (ref 4.0–10.5)

## 2014-11-10 LAB — BRAIN NATRIURETIC PEPTIDE: B Natriuretic Peptide: 328.1 pg/mL — ABNORMAL HIGH (ref 0.0–100.0)

## 2014-11-10 MED ORDER — INSULIN ASPART 100 UNIT/ML ~~LOC~~ SOLN
8.0000 [IU] | Freq: Three times a day (TID) | SUBCUTANEOUS | Status: DC
  Administered 2014-11-11 (×2): 8 [IU] via SUBCUTANEOUS

## 2014-11-10 MED ORDER — IRBESARTAN 75 MG PO TABS
37.5000 mg | ORAL_TABLET | Freq: Every day | ORAL | Status: DC
  Administered 2014-11-10: 37.5 mg via ORAL
  Filled 2014-11-10 (×2): qty 0.5

## 2014-11-10 MED ORDER — INSULIN GLARGINE 100 UNIT/ML ~~LOC~~ SOLN
30.0000 [IU] | Freq: Every day | SUBCUTANEOUS | Status: DC
  Administered 2014-11-10: 30 [IU] via SUBCUTANEOUS
  Filled 2014-11-10 (×2): qty 0.3

## 2014-11-10 MED ORDER — ATORVASTATIN CALCIUM 20 MG PO TABS
20.0000 mg | ORAL_TABLET | Freq: Every day | ORAL | Status: DC
  Administered 2014-11-10 – 2014-11-11 (×2): 20 mg via ORAL
  Filled 2014-11-10 (×2): qty 1

## 2014-11-10 MED ORDER — INSULIN GLARGINE 100 UNIT/ML ~~LOC~~ SOLN
5.0000 [IU] | Freq: Once | SUBCUTANEOUS | Status: AC
  Administered 2014-11-10: 5 [IU] via SUBCUTANEOUS
  Filled 2014-11-10 (×2): qty 0.05

## 2014-11-10 MED ORDER — FUROSEMIDE 20 MG PO TABS
20.0000 mg | ORAL_TABLET | Freq: Every day | ORAL | Status: DC
  Administered 2014-11-10 – 2014-11-12 (×3): 20 mg via ORAL
  Filled 2014-11-10 (×3): qty 1

## 2014-11-10 NOTE — Consult Note (Signed)
[facility] Medical Consultation  Loman Logan ZOX:[id number] DOB: Dec 18, 1960 DOA: 10/23/2014 PCP: [name], MD   Requesting physician: Dr. Elease Hashimoto  Date of consultation: 11/04/14 Reason for consultation: Abd pain, Diabetes    Impression/Recommendations Principal Problem:   ST elevation myocardial infarction (STEMI) of lateral wall, initial episode of care Active Problems:   Hyperlipidemia with target LDL less than 70   CAD S/P percutaneous coronary angioplasty: DES x 2 to mid-dLAD, x 1 to ostial-prox LAD   Chronic combined systolic and diastolic congestive heart failure   Acute pancreatitis   Coronary stent thrombosis   Essential hypertension   Type 2 diabetes mellitus without complication   Hypoxia   Abdominal distension   Diabetes type 2, controlled   Abdominal pain   Cholelithiasis with cholecystitis of other acuity   Acute renal failure syndrome   Thrombocytopenia   Systolic and diastolic CHF, chronic   HLD (hyperlipidemia)   Gallstone pancreatitis   Ileus    ST elevation myocardial infarction (STEMI) of lateral wall: -s/p POBA to prox and mid LAD -further care per cardiology service -patient on ASA, COREG, ACE, Aldactone and brillinta  -Echocardiogram; see results below    Chronic combined systolic and diastolic congestive heart failure -per cardiology service -appears to be compensated currently   HLD -target LDL< 70 -was not on statins at home -holding statin currently given transaminitis  Acute suspected gallstone pancreatitis - transaminitis/abdominal pain  - lipase on admission > 3000 but has since normalized - LFT were elevated with [place] notable for cholelithiasis and gallbladder wall thickening - d/w GI on call Dr. [name], pt likely passed stone, will need referral for outpatient follow up, advised to try to schedule with Dr. [name] once acute medical issues resolved - pt will eventually need lap chole  - per GI OK to continue  advancing diet as pt able to tolerate  - LFTs have now normalized   Ileus - pulled out NGT 8/26 - tolerating current diet w/o difficulty  - continue bowel regimen   Diabetes Mellitus type 2 controlled @@ -8/24 hemoglobin A1c = 6.4   -Increase Lantus to 30 units daily -8/30 Lantus 5 units 1 -Continue resistant SSI -NovoLog 8 units QAC  Acute renal failure -Most likely multifactorial to include STEMI, and contrast load  -Resolved  Thrombocytopenia -Hit panel normal   I will followup again tomorrow. Please contact me if I can be of assistance in the meanwhile. Thank you for this consultation.  Chief Complaint: Multiple medical problems  HPI:  31 WM PMHx DM Type 2, HTN, systolic CHF, CAD s/p anterior wall MI (2011) with residual 3VD that was not amendeble to revascularization c/b LVSD (EF nadir 30-35%, improved to 45-50% in 2012) HLD, obesity.  Presents with lateral STEMI in the setting of pancreatitis.   Review of symptoms The patient denies anorexia, fever, weight loss,, vision loss, decreased hearing, hoarseness, chest pain, syncope, dyspnea on exertion, peripheral edema, balance deficits, hemoptysis, abdominal pain, melena, hematochezia, severe indigestion/heartburn, hematuria, incontinence, genital sores, muscle weakness, suspicious skin lesions, transient blindness, difficulty walking, depression, unusual weight change, abnormal bleeding, enlarged lymph nodes, angioedema, and breast masses.   Consultants:   Procedure/Significant Events: 8/26 echocardiogram;LVEF= 20% to 25%. Akinesis of the entireapical myocardium/apical segments of the anterior, lateral, inferolateral, septal and inferior myocardium. Akinesis of the midanterolateral myocardium. Akinesis of the midanterior, inferolateral, and inferior myocardium.   Culture    Antibiotics:   DVT prophylaxis:    Past Medical History  Diagnosis Date  . CHF  12/14/2009    EF 35% at the time of MI.  50% echo  6/12  . CAD S/P percutaneous coronary angioplasty     November 14, 2009 anterior MI LAD 80% ostial, 70% proximal followed by total occlusion, ramus intermediate total occlusion circumflex 80%, PDA 90% lesions. Overlapping drug-eluting stents to the LAD  . ST elevation myocardial infarction (STEMI) of anterior wall 11/13/2009  . Diabetes mellitus   . HTN (hypertension)   . Hyperlipidemia   . Diabetes mellitus   . Overweight    Past Surgical History  Procedure Laterality Date  . Cardiac catheterization N/A 11/02/2014    Procedure: Left Heart Cath and Coronary Angiography;  Surgeon: Marykay Lex, MD;  Location: [facility] INVASIVE CV LAB;  Service: Cardiovascular;  Laterality: N/A;  . Cardiac catheterization N/A 11/07/2014    Procedure: Coronary Stent Intervention;  Surgeon: Marykay Lex, MD;  Location: [facility] INVASIVE CV LAB;  Service: Cardiovascular;  Laterality: N/A;   Social History:  reports that he has never smoked. He has never used smokeless tobacco. He reports that he does not drink alcohol. His drug history is not on file.  No Known Allergies Family History  Problem Relation Age of Onset  . Coronary artery disease    . Heart attack    . Cardiomyopathy    . Heart disease      Prior to Admission medications   Medication Sig Start Date End Date Taking? Authorizing Provider  acetaminophen (TYLENOL) 325 MG tablet Take 650 mg by mouth every 6 (six) hours as needed.     Yes Historical Provider, MD  amLODipine (NORVASC) 5 MG tablet Take 5 mg by mouth daily.  05/19/13  Yes Historical Provider, MD  aspirin 325 MG tablet Take 325 mg by mouth daily.     Yes Historical Provider, MD  carvedilol (COREG) 12.5 MG tablet Take 12.5 mg by mouth 2 (two) times daily with a meal.   Yes Historical Provider, MD  enalapril (VASOTEC) 10 MG tablet Take 1 tablet (10 mg total) by mouth 2 (two) times daily. 06/27/10  Yes Rollene Rotunda, MD  furosemide (LASIX) 20 MG tablet Take 20 mg by mouth daily.   Yes Historical  Provider, MD  glimepiride (AMARYL) 4 MG tablet Take 4 mg by mouth daily.  05/19/13  Yes Historical Provider, MD  isosorbide mononitrate (IMDUR) 60 MG 24 hr tablet Take 60 mg by mouth daily.   Yes Historical Provider, MD  metFORMIN (GLUCOPHAGE) 1000 MG tablet Take 1,000 mg by mouth 2 (two) times daily with a meal.   Yes Historical Provider, MD  nitroGLYCERIN (NITROSTAT) 0.4 MG SL tablet Place 1 tablet (0.4 mg total) under the tongue every 5 (five) minutes as needed. 07/10/13  Yes Rollene Rotunda, MD  NON FORMULARY Take 1 tablet by mouth daily. Study Drug   Yes Historical Provider, MD  spironolactone (ALDACTONE) 25 MG tablet Take 12.5 mg by mouth daily.   Yes Historical Provider, MD  HYDROcodone-acetaminophen (NORCO) 5-325 MG per tablet Take 1-2 tablets by mouth every 4 (four) hours as needed. 11/03/13   Blane Ohara, MD  HYDROcodone-acetaminophen (NORCO/VICODIN) 5-325 MG per tablet Take 1 tablet by mouth every 4 (four) hours as needed. 11/18/13   Ivery Quale, PA-C  meloxicam (MOBIC) 15 MG tablet Take 1 tablet (15 mg total) by mouth daily. 11/18/13   Ivery Quale, PA-C  naproxen (NAPROSYN) 500 MG tablet Take 1 tablet (500 mg total) by mouth 2 (two) times daily. 11/03/13   Blane Ohara, MD  Physical Exam: Blood pressure 124/77, pulse 108, temperature 98.2 F (36.8 C), temperature source Oral, resp. rate 18, height  (1.753 m), weight 131 kg (288 lb 12.8 oz), SpO2 93 %. Filed Vitals:   11/10/14 1200 11/10/14 1400 11/10/14 1615 11/10/14 1650  BP: 109/67 102/68 124/77 124/77  Pulse: 104 109 106 108  Temp:    98.2 F (36.8 C)  TempSrc:    Oral  Resp:    18  Height:      Weight:      SpO2: 92% 92%  93%      General: A/OX4, negative abdominal pain, negative N/V, negative CP. Sitting in chair comfortably  Eyes: PERRL, no icterus, no nystagmus  ENT: no erythema or exudates inside his mouth; [place] in place; no drainage out of ears or nostrils   Neck: no thyromegaly, no bruits, short thick neck    Cardiovascular: S1 and S2, no rubs or gallops, no murmurs   Respiratory: CTA bilaterally   Abdomen: Positive bowel sounds, distended, negative tenderness to palpation in epigastric region mainly   Skin: no open wounds, no petechiae, no rash  Musculoskeletal: no joint swelling appreciated,   Psychiatric: stable mood, no hallucinations   Neurologic: no focal deficit appreciated   Labs on Admission:  Basic Metabolic Panel:  Recent Labs Lab 11/04/14 0002  11/06/14 1923 11/07/14 0559 11/08/14 0644 11/09/14 0234 11/10/14 0236  NA  --   < > 131* 132* 138 135 131*  K  --   < > 4.1 3.6 3.3* 4.9 4.6  CL  --   < > 100* 103 111 103 98*  CO2  --   < > 19* 20* 19* 27 26  GLUCOSE  --   < > 173* 162* 150* 189* 245*  BUN  --   < > 76* 63* 29* 24* 25*  CREATININE  --   < > 2.12* 1.38* 0.74 1.01 1.02  CALCIUM  --   < > 6.6* 6.6* 5.7* 7.8* 7.8*  MG 1.8  --   --  1.9 2.0 2.6*  --   < > = values in this interval not displayed. Liver Function Tests:  Recent Labs Lab 11/05/14 0540 11/06/14 0516 11/08/14 0644 11/09/14 0234 11/10/14 0236  AST 236* 92* 32 39 39  ALT 250* 143* 50 50 38  ALKPHOS 52 50 41 59 69  BILITOT 3.9* 1.9* 1.5* 1.6* 1.6*  PROT 6.1* 6.6 4.6* 6.0* 5.8*  ALBUMIN 2.9* 2.8* 1.8* 2.2* 2.0*    Recent Labs Lab 11/04/14 0245 11/05/14 0540 11/07/14 0559 11/08/14 0644 11/09/14 0234  LIPASE 936* 702* 52* 22 27  AMYLASE 1225*  --   --   --   --    No results for input(s): AMMONIA in the last 168 hours. CBC:  Recent Labs Lab 11/06/14 0516 11/07/14 0559 11/08/14 0644 11/09/14 0234 11/10/14 0236  WBC 9.5 9.0 8.1 9.7 15.3*  NEUTROABS  --  8.2* 7.6 9.2* 12.2*  HGB 13.9 12.8* 11.1* 13.7 14.3  HCT 40.4 37.0* 32.5* 41.0 41.8  MCV 91.0 88.9 91.5 92.6 92.3  PLT 106* 110* 118* 151 150   Cardiac Enzymes:  Recent Labs Lab 11/04/14 0510  11/06/14 1923  11/07/14 0559 11/07/14 1128 11/07/14 1655 11/07/14 2329 11/08/14 0644  CKTOTAL 3005*  --  497*  --    --   --   --   --   --   CKMB >260.0*  --   --   --   --   --   --   --   --  TROPONINI 58.64*  < > 13.76*  < > 10.15* 8.41* 8.30* 4.68* 4.41*  < > = values in this interval not displayed. BNP: Invalid input(s): POCBNP CBG:  Recent Labs Lab 11/09/14 1702 11/09/14 2116 11/10/14 0724 11/10/14 1132 11/10/14 1649  GLUCAP 295* 222* 215* 252* 301*    Radiological Exams on Admission: No results found.  EKG: N/A   Care during the described time interval was provided by me .  I have reviewed this patient's available data, including medical history, events of note, physical examination, and all test results as part of my evaluation. I have personally reviewed and interpreted all radiology studies.  Time spent: 35 minutes  Drema Dallas [facility] Pager [phone]  If 7PM-7AM, please contact night-coverage [email or web address] Password [facility] 11/10/2014, 8:33 PM

## 2014-11-10 NOTE — Progress Notes (Signed)
Subjective:  No recurrent chest pain  Objective:   Vital Signs : Filed Vitals:   11/10/14 0722 11/10/14 0800 11/10/14 0858 11/10/14 1056  BP: 113/73 112/75    Pulse: 103 108 110 113  Temp: 98.1 F (36.7 C)     TempSrc: Oral     Resp: 18     Height:      Weight:      SpO2: 93% 90%  93%    Intake/Output from previous day:  Intake/Output Summary (Last 24 hours) at 11/10/14 1131 Last data filed at 11/10/14 0900  Gross per 24 hour  Intake   1265 ml  Output   2725 ml  Net  -1460 ml    I/O since admission: -1467  Wt Readings from Last 3 Encounters:  11/10/14 288 lb 12.8 oz (131 kg)  11/18/13 265 lb (120.203 kg)  11/03/13 265 lb (120.203 kg)    Medications: . antiseptic oral rinse  7 mL Mouth Rinse BID  . aspirin  81 mg Oral Daily  . carvedilol  25 mg Oral BID WC  . heparin subcutaneous  5,000 Units Subcutaneous 3 times per day  . insulin aspart  0-20 Units Subcutaneous TID WC  . insulin glargine  30 Units Subcutaneous QHS  . insulin glargine  5 Units Subcutaneous Once  . isosorbide mononitrate  30 mg Oral Daily  . polyethylene glycol  17 g Oral Daily  . senna-docusate  1 tablet Oral BID  . simethicone  40 mg Oral TID  . ticagrelor  90 mg Oral BID    . sodium chloride Stopped (11/06/14 0700)    Physical Exam:   General appearance: alert, cooperative and no distress Neck: no adenopathy, no JVD, supple, symmetrical, trachea midline and thyroid not enlarged, symmetric, no tenderness/mass/nodules Lungs: no wheezing Heart: regular rate and rhythm and 1/6 sem Abdomen: mildly tense; BS+ Extremities: no edema, redness or tenderness in the calves or thighs Pulses: 2+ and symmetric Skin: Skin color, texture, turgor normal. No rashes or lesions Neurologic: Grossly normal   Rate: 105  Rhythm: sinus tachycardia with PVC  ECG (independently read by me): ST at 106;  Q waves V1-6; T inversion I,avL; low voltage       Lab Results:   Recent Labs  11/08/14 0644  11/09/14 0234 11/10/14 0236  NA 138 135 131*  K 3.3* 4.9 4.6  CL 111 103 98*  CO2 19* 27 26  GLUCOSE 150* 189* 245*  BUN 29* 24* 25*  CREATININE 0.74 1.01 1.02  CALCIUM 5.7* 7.8* 7.8*  MG 2.0 2.6*  --     Hepatic Function Latest Ref Rng 11/10/2014 11/09/2014 11/08/2014  Total Protein 6.5 - 8.1 g/dL 5.8(L) 6.0(L) 4.6(L)  Albumin 3.5 - 5.0 g/dL 2.0(L) 2.2(L) 1.8(L)  AST 15 - 41 U/L 39 39 32  ALT 17 - 63 U/L 38 50 50  Alk Phosphatase 38 - 126 U/L 69 59 41  Total Bilirubin 0.3 - 1.2 mg/dL 1.6(H) 1.6(H) 1.5(H)  Bilirubin, Direct 0.1 - 0.5 mg/dL - - -     Recent Labs  11/08/14 0644 11/09/14 0234 11/10/14 0236  WBC 8.1 9.7 15.3*  NEUTROABS 7.6 9.2* 12.2*  HGB 11.1* 13.7 14.3  HCT 32.5* 41.0 41.8  MCV 91.5 92.6 92.3  PLT 118* 151 150     Recent Labs  11/07/14 1655 11/07/14 2329 11/08/14 0644  TROPONINI 8.30* 4.68* 4.41*    Lab Results  Component Value Date   TSH 1.122 11/13/2009  No results for input(s): HGBA1C in the last 72 hours.   Recent Labs  11/08/14 0644 11/09/14 0234 11/10/14 0236  PROT 4.6* 6.0* 5.8*  ALBUMIN 1.8* 2.2* 2.0*  AST 32 39 39  ALT 50 50 38  ALKPHOS 41 59 69  BILITOT 1.5* 1.6* 1.6*   No results for input(s): INR in the last 72 hours. BNP (last 3 results)  Recent Labs  11/10/14 0236  BNP 328.1*    ProBNP (last 3 results) No results for input(s): PROBNP in the last 8760 hours.   Lipid Panel     Component Value Date/Time   CHOL 139 11/04/2014 0510   TRIG 99 11/04/2014 0510   HDL 81 11/04/2014 0510   CHOLHDL 1.7 11/04/2014 0510   VLDL 20 11/04/2014 0510   LDLCALC 38 11/04/2014 0510     Imaging:  No results found.    Assessment/Plan:   Principal Problem:   ST elevation myocardial infarction (STEMI) of lateral wall, initial episode of care Active Problems:   Hyperlipidemia with target LDL less than 70   CAD S/P percutaneous coronary angioplasty: DES x 2 to mid-dLAD, x 1 to ostial-prox LAD   Chronic combined  systolic and diastolic congestive heart failure   Acute pancreatitis   Coronary stent thrombosis   Essential hypertension   Type 2 diabetes mellitus without complication   Hypoxia   Abdominal distension   Diabetes type 2, controlled   Abdominal pain   Cholelithiasis with cholecystitis of other acuity   Acute renal failure syndrome   Thrombocytopenia  1. Day 7 s/p STEMI with stent thrombosis of prior LAD stents; rx with POBA but per Dr. Allison Quarry cath note, "He will likely require PCI with a new stent in the intervening segment between the ostial and mid LAD stents. However this can be performed once he has recovered from his pancreatitis."   ECG with QS V1-6. 2. Post Infarct CHF, acute on chronic combined;  Will start irbesartan 37.5 mg and titrate as BP allows per [facility] trial data (valsartan, not on formulary); BNP 328 today; EF 20 - 25% on 8/26;  May need life-vest prior to dc. 3. Pancreatitis: lipase has normalized 4. Renal insufficiency; resolved from peak Cr 3.6; 1.02 5. Hyperlipidemia; LFT's normal will try to initiate low dose atorvastain initailly at 20 mg and make certain he tolerates from LFT perspective prior to maximizing dose. 6. Type 2 DM 7. ? OSA    Justin Sine, MD, [facility] 11/10/2014, 11:31 AM

## 2014-11-10 NOTE — Progress Notes (Signed)
CARDIAC REHAB PHASE I   PRE:  Rate/Rhythm: 106 ST    BP: sitting 109/78    SaO2: 95 5L  MODE:  Ambulation: 700 ft   POST:  Rate/Rhythm: 116 ST    BP: sitting 102/68     SaO2: 94 4L, 93 2L  Pt moving well. Assisted to BR with RW and 6L. Pt walked 350 ft on 6L. Felt good and SAO2 94 6L. Turned O2 down to 4L and walked another lap (350 ft). No c/o except began to fatigue toward end. SaO2 94 4L. To recliner. Turned O2 down to 3L and then eventually 2L before I left. Pt maintaining 92-93 2L while asleep at this moment. Pt with increased ectopy after walking. Will continue to follow. [id number] Lovett [place] CES, ACSM 11/10/2014 2:16 PM

## 2014-11-10 NOTE — Progress Notes (Signed)
Physical Therapy Treatment Patient Details Name: Justin [name] MRN: [id number] DOB: 1960-06-07 Today's Date: 11/10/2014    History of Present Illness 13M with DM, HTN, HLD, obesity and CAD s/p anterior wall MI (2011) with residual 3VD that was not amendeble to revascularization c/b LVSD (EF nadir 30-35%, improved to 45-50% in 2012) presents with lateral STEMI in the setting of pancreatitis.     PT Comments    Pt with improved cognition and mobility today. Pt continues to require cues for safety, RW use and oxygen. Able to maintain sats 93-98% on 6L with gait today other than brief drop to 87% when attempting to talk and walk at the same time. Pt educated for HEP given decreased mobility acutely.  Follow Up Recommendations  No PT follow up     Equipment Recommendations  Rolling walker with 5" wheels    Recommendations for Other Services       Precautions / Restrictions Precautions Precautions: Fall Precaution Comments: watch sats Restrictions Weight Bearing Restrictions: No    Mobility  Bed Mobility Overal bed mobility: Modified Independent                Transfers Overall transfer level: Needs assistance   Transfers: Sit to/from Stand Sit to Stand: Supervision         General transfer comment: cues for sequence with assist to manage lines  Ambulation/Gait Ambulation/Gait assistance: Min guard Ambulation Distance (Feet): 500 Feet Assistive device: Rolling walker (2 wheeled) Gait Pattern/deviations: Step-through pattern;Decreased stride length;Trunk flexed   Gait velocity interpretation: Below normal speed for age/gender General Gait Details: cues for safety, position in RW and breathing technique   Stairs            Wheelchair Mobility    Modified Rankin (Stroke Patients Only)       Balance     Sitting balance-Leahy Scale: Good       Standing balance-Leahy Scale: Fair                      Cognition Arousal/Alertness:  Awake/alert Behavior During Therapy: Flat affect Overall Cognitive Status: Impaired/Different from baseline Area of Impairment: Safety/judgement     Memory: Decreased short-term memory   Safety/Judgement: Decreased awareness of deficits          Exercises General Exercises - Lower Extremity Long Arc Quad: AROM;Seated;Both;20 reps Hip ABduction/ADduction: AROM;Seated;Both;20 reps Hip Flexion/Marching: AROM;Seated;Both;20 reps Toe Raises: AROM;Seated;Both;20 reps Heel Raises: AROM;Seated;Both;20 reps    General Comments        Pertinent Vitals/Pain Pain Assessment: No/denies pain  HR 104-109  sats 93% on 6L at rest    Home Living                      Prior Function            PT Goals (current goals can now be found in the care plan section) Progress towards PT goals: Progressing toward goals    Frequency       PT Plan Current plan remains appropriate    Co-evaluation             End of Session Equipment Utilized During Treatment: Oxygen Activity Tolerance: Patient tolerated treatment well Patient left: in chair;with call bell/phone within reach;with chair alarm set;with family/visitor present     Time: 4098-1191 PT Time Calculation (min) (ACUTE ONLY): 31 min  Charges:  $Gait Training: 8-22 mins $Therapeutic Exercise: 8-22 mins  G CodesToney Sang Beth December 05, 2014, 11:20 AM Delaney Meigs, PT [phone]

## 2014-11-11 ENCOUNTER — Inpatient Hospital Stay (HOSPITAL_COMMUNITY): Payer: BLUE CROSS/BLUE SHIELD

## 2014-11-11 DIAGNOSIS — D72829 Elevated white blood cell count, unspecified: Secondary | ICD-10-CM

## 2014-11-11 LAB — URINALYSIS, ROUTINE W REFLEX MICROSCOPIC
BILIRUBIN URINE: NEGATIVE
HGB URINE DIPSTICK: NEGATIVE
KETONES UR: NEGATIVE mg/dL
Leukocytes, UA: NEGATIVE
Nitrite: NEGATIVE
PROTEIN: NEGATIVE mg/dL
Specific Gravity, Urine: 1.024 (ref 1.005–1.030)
Urobilinogen, UA: 2 mg/dL — ABNORMAL HIGH (ref 0.0–1.0)
pH: 5.5 (ref 5.0–8.0)

## 2014-11-11 LAB — CBC WITH DIFFERENTIAL/PLATELET
Basophils Absolute: 0 10*3/uL (ref 0.0–0.1)
Basophils Relative: 0 % (ref 0–1)
EOS PCT: 1 % (ref 0–5)
Eosinophils Absolute: 0.2 10*3/uL (ref 0.0–0.7)
HEMATOCRIT: 38.6 % — AB (ref 39.0–52.0)
HEMOGLOBIN: 13 g/dL (ref 13.0–17.0)
Lymphocytes Relative: 7 % — ABNORMAL LOW (ref 12–46)
Lymphs Abs: 1.3 10*3/uL (ref 0.7–4.0)
MCH: 31.3 pg (ref 26.0–34.0)
MCHC: 33.7 g/dL (ref 30.0–36.0)
MCV: 93 fL (ref 78.0–100.0)
MONOS PCT: 6 % (ref 3–12)
Monocytes Absolute: 1.1 10*3/uL — ABNORMAL HIGH (ref 0.1–1.0)
NEUTROS PCT: 86 % — AB (ref 43–77)
Neutro Abs: 16.2 10*3/uL — ABNORMAL HIGH (ref 1.7–7.7)
Platelets: 201 10*3/uL (ref 150–400)
RBC: 4.15 MIL/uL — AB (ref 4.22–5.81)
RDW: 14.1 % (ref 11.5–15.5)
WBC: 18.8 10*3/uL — AB (ref 4.0–10.5)

## 2014-11-11 LAB — GLUCOSE, CAPILLARY
GLUCOSE-CAPILLARY: 203 mg/dL — AB (ref 65–99)
Glucose-Capillary: 250 mg/dL — ABNORMAL HIGH (ref 65–99)
Glucose-Capillary: 320 mg/dL — ABNORMAL HIGH (ref 65–99)
Glucose-Capillary: 250 mg/dL — ABNORMAL HIGH (ref 65–99)

## 2014-11-11 LAB — LIPASE, BLOOD: Lipase: 35 U/L (ref 22–51)

## 2014-11-11 LAB — BASIC METABOLIC PANEL
ANION GAP: 8 (ref 5–15)
BUN: 23 mg/dL — ABNORMAL HIGH (ref 6–20)
CHLORIDE: 97 mmol/L — AB (ref 101–111)
CO2: 27 mmol/L (ref 22–32)
Calcium: 7.8 mg/dL — ABNORMAL LOW (ref 8.9–10.3)
Creatinine, Ser: 1.08 mg/dL (ref 0.61–1.24)
GFR calc non Af Amer: >60 mL/min (ref >60)
Glucose, Bld: 266 mg/dL — ABNORMAL HIGH (ref 65–99)
Potassium: 5 mmol/L (ref 3.5–5.1)
Sodium: 132 mmol/L — ABNORMAL LOW (ref 135–145)

## 2014-11-11 LAB — URINE MICROSCOPIC-ADD ON

## 2014-11-11 LAB — AMYLASE: AMYLASE: 59 U/L (ref 28–100)

## 2014-11-11 MED ORDER — METFORMIN HCL 500 MG PO TABS
1000.0000 mg | ORAL_TABLET | Freq: Two times a day (BID) | ORAL | Status: DC
  Administered 2014-11-11 – 2014-11-12 (×2): 1000 mg via ORAL
  Filled 2014-11-11 (×3): qty 2

## 2014-11-11 MED ORDER — IRBESARTAN 75 MG PO TABS
37.5000 mg | ORAL_TABLET | Freq: Two times a day (BID) | ORAL | Status: DC
  Administered 2014-11-11 (×2): 37.5 mg via ORAL
  Filled 2014-11-11 (×4): qty 0.5

## 2014-11-11 MED ORDER — POLYETHYLENE GLYCOL 3350 17 G PO PACK
17.0000 g | PACK | Freq: Two times a day (BID) | ORAL | Status: DC
  Administered 2014-11-11 – 2014-11-15 (×4): 17 g via ORAL
  Filled 2014-11-11 (×5): qty 1

## 2014-11-11 MED ORDER — WHITE PETROLATUM GEL
Status: AC
Start: 2014-11-11 — End: 2014-11-11
  Administered 2014-11-11: 0.2
  Filled 2014-11-11: qty 1

## 2014-11-11 MED ORDER — WHITE PETROLATUM GEL
Status: DC | PRN
  Administered 2014-11-11 – 2014-11-12 (×2): 0.2 via TOPICAL

## 2014-11-11 MED ORDER — GLIMEPIRIDE 4 MG PO TABS: 4.0000 mg | ORAL_TABLET | Freq: Every day | ORAL | Status: DC

## 2014-11-11 MED ORDER — CARVEDILOL 25 MG PO TABS
37.5000 mg | ORAL_TABLET | Freq: Two times a day (BID) | ORAL | Status: DC
  Administered 2014-11-11: 37.5 mg via ORAL
  Filled 2014-11-11 (×4): qty 1

## 2014-11-11 MED ORDER — METFORMIN HCL 500 MG PO TABS: 1000.0000 mg | ORAL_TABLET | Freq: Two times a day (BID) | ORAL | Status: DC

## 2014-11-11 MED ORDER — GLIMEPIRIDE 4 MG PO TABS
4.0000 mg | ORAL_TABLET | Freq: Every day | ORAL | Status: DC
  Administered 2014-11-12: 4 mg via ORAL
  Filled 2014-11-11 (×2): qty 1

## 2014-11-11 MED ORDER — INSULIN GLARGINE 100 UNIT/ML ~~LOC~~ SOLN
16.0000 [IU] | Freq: Every day | SUBCUTANEOUS | Status: DC
  Administered 2014-11-11: 16 [IU] via SUBCUTANEOUS
  Filled 2014-11-11 (×2): qty 0.16

## 2014-11-11 NOTE — Progress Notes (Signed)
Subjective:  No recurrent chest pain; appears less congested  Objective:   Vital Signs : Filed Vitals:   11/10/14 2340 11/11/14 0414 11/11/14 0611 11/11/14 0732  BP: 100/59 106/63  96/56  Pulse: 96 101 106 100  Temp: 98.3 F (36.8 C) 98.1 F (36.7 C)  97.4 F (36.3 C)  TempSrc: Oral Oral  Oral  Resp:      Height:      Weight:   289 lb 9.6 oz (131.362 kg)   SpO2: 92% 89% 91% 91%    Intake/Output from previous day:  Intake/Output Summary (Last 24 hours) at 11/11/14 0951 Last data filed at 11/11/14 0900  Gross per 24 hour  Intake    100 ml  Output   2140 ml  Net  -2040 ml    I/O since admission: -3507  Wt Readings from Last 3 Encounters:  11/11/14 289 lb 9.6 oz (131.362 kg)  11/18/13 265 lb (120.203 kg)  11/03/13 265 lb (120.203 kg)    Medications: . antiseptic oral rinse  7 mL Mouth Rinse BID  . aspirin  81 mg Oral Daily  . atorvastatin  20 mg Oral q1800  . carvedilol  25 mg Oral BID WC  . furosemide  20 mg Oral Daily  . heparin subcutaneous  5,000 Units Subcutaneous 3 times per day  . insulin aspart  0-20 Units Subcutaneous TID WC  . insulin aspart  8 Units Subcutaneous TID WC  . insulin glargine  30 Units Subcutaneous QHS  . irbesartan  37.5 mg Oral Daily  . isosorbide mononitrate  30 mg Oral Daily  . polyethylene glycol  17 g Oral Daily  . senna-docusate  1 tablet Oral BID  . simethicone  40 mg Oral TID  . ticagrelor  90 mg Oral BID    . sodium chloride Stopped (11/06/14 0700)    Physical Exam:   General appearance: alert, cooperative and no distress Neck: no adenopathy, no JVD, supple, symmetrical, trachea midline and thyroid not enlarged, symmetric, no tenderness/mass/nodules Lungs: no wheezing Heart: regular rate and rhythm and 1/6 sem Abdomen: mildly tense; BS+ Extremities: no edema, redness or tenderness in the calves or thighs Pulses: 2+ and symmetric Skin: Skin color, texture, turgor normal. No rashes or lesions Neurologic: Grossly  normal   Rate: 100  Rhythm: sinus tachycardia   ECG (independently read by me): ST at 106;  Q waves V1-6; T inversion I,avL; low voltage       Lab Results:   Recent Labs  11/09/14 0234 11/10/14 0236 11/11/14 0257  NA 135 131* 132*  K 4.9 4.6 5.0  CL 103 98* 97*  CO2 27 26 27   GLUCOSE 189* 245* 266*  BUN 24* 25* 23*  CREATININE 1.01 1.02 1.08  CALCIUM 7.8* 7.8* 7.8*  MG 2.6*  --   --     Hepatic Function Latest Ref Rng 11/10/2014 11/09/2014 11/08/2014  Total Protein 6.5 - 8.1 g/dL 5.8(L) 6.0(L) 4.6(L)  Albumin 3.5 - 5.0 g/dL 2.0(L) 2.2(L) 1.8(L)  AST 15 - 41 U/L 39 39 32  ALT 17 - 63 U/L 38 50 50  Alk Phosphatase 38 - 126 U/L 69 59 41  Total Bilirubin 0.3 - 1.2 mg/dL 1.6(H) 1.6(H) 1.5(H)  Bilirubin, Direct 0.1 - 0.5 mg/dL - - -     Recent Labs  11/09/14 0234 11/10/14 0236 11/11/14 0257  WBC 9.7 15.3* 18.8*  NEUTROABS 9.2* 12.2* 16.2*  HGB 13.7 14.3 13.0  HCT 41.0 41.8 38.6*  MCV 92.6  92.3 93.0  PLT 151 150 201    No results for input(s): TROPONINI in the last 72 hours.  Invalid input(s): CK, MB  Lab Results  Component Value Date   TSH 1.122 11/13/2009   No results for input(s): HGBA1C in the last 72 hours.   Recent Labs  11/09/14 0234 11/10/14 0236  PROT 6.0* 5.8*  ALBUMIN 2.2* 2.0*  AST 39 39  ALT 50 38  ALKPHOS 59 69  BILITOT 1.6* 1.6*   No results for input(s): INR in the last 72 hours. BNP (last 3 results)  Recent Labs  11/10/14 0236  BNP 328.1*    ProBNP (last 3 results) No results for input(s): PROBNP in the last 8760 hours.   Lipid Panel     Component Value Date/Time   CHOL 139 11/04/2014 0510   TRIG 99 11/04/2014 0510   HDL 81 11/04/2014 0510   CHOLHDL 1.7 11/04/2014 0510   VLDL 20 11/04/2014 0510   LDLCALC 38 11/04/2014 0510     Imaging:  No results found.    Assessment/Plan:   Principal Problem:   ST elevation myocardial infarction (STEMI) of lateral wall, initial episode of care Active Problems:    Hyperlipidemia with target LDL less than 70   CAD S/P percutaneous coronary angioplasty: DES x 2 to mid-dLAD, x 1 to ostial-prox LAD   Chronic combined systolic and diastolic congestive heart failure   Acute pancreatitis   Coronary stent thrombosis   Essential hypertension   Type 2 diabetes mellitus without complication   Hypoxia   Abdominal distension   Diabetes type 2, controlled   Abdominal pain   Cholelithiasis with cholecystitis of other acuity   Acute renal failure syndrome   Thrombocytopenia   Systolic and diastolic CHF, chronic   HLD (hyperlipidemia)   Gallstone pancreatitis   Ileus  1. Day 8 s/p STEMI with stent thrombosis of prior LAD stents; rx with POBA but per Dr. Allison Quarry cath note, "He will likely require PCI with a new stent in the intervening segment between the ostial and mid LAD stents. However this can be performed once he has recovered from his pancreatitis."   ECG with QS V1-6. 2. Post Infarct CHF, acute on chronic combined;  Will start irbesartan 37.5 mg and titrate as BP allows per [employer] trial data (valsartan, not on formulary); BNP 328 today; EF 20 - 25% on 8/26;  May need life-vest prior to dc. 3. Pancreatitis: lipase has normalized 4. Renal insufficiency; resolved from peak Cr 3.6; 1.08.  Tolerating  the initiation of ARB 5. Leukocytosis:  WBC increased to 18.8 from 9.7 on 8/29 5. Hyperlipidemia; LFT's normal x bili 1.6. Atorvastain started 20 mg and make certain he tolerates from LFT perspective prior to maximizing dose. 6. Type 2 DM 7. ? OSA  Good diuresis yesterday.  Will advance irbesartan to 37.5 mg bid today with plans to titrate to higher dose as BP allows.  Will titrate carvedilol to 37.5 mg bid.  Tolerating initiation of atorvastatin, plan ultimately to further titrate if LFT's remain stable. Increase WBC with mild toxic granulations; not febrile, not a steroids, Will check cxr, abd x ray, sputum, and bc, and u/a. Will f/u amylase/  lipase.   Troy Sine, MD, [facility] 11/11/2014, 9:51 AM

## 2014-11-11 NOTE — Progress Notes (Signed)
Physical Therapy Treatment Patient Details Name: Justin [name] MRN: [id number] DOB: 1960-11-30 Today's Date: 11/11/2014    History of Present Illness 38M with DM, HTN, HLD, obesity and CAD s/p anterior wall MI (2011) with residual 3VD that was not amendeble to revascularization c/b LVSD; Presents with lateral STEMI in the setting of acute pancreatitis; EF 20-25%    PT Comments    Noted great progress/tolerance with ambulation 8/30 and SaO2 levels with O2 on 2L. Today pt could only maintain 88-90% on 6L while walking (had started on 2L at rest SaO2 89-91% with pursed lip breathing and began ambulation on 3L). Not labored or distressed with walking (even had to cue pt to stop talking and take standing rests to incr SaO2 to 90%). Overall balance is good and will work towards not using RW (as PTA).    Follow Up Recommendations  No PT follow up     Equipment Recommendations  Rolling walker with 5" wheels    Recommendations for Other Services       Precautions / Restrictions Precautions Precautions: Fall Precaution Comments: watch sats Restrictions Weight Bearing Restrictions: No    Mobility  Bed Mobility                  Transfers Overall transfer level: Needs assistance Equipment used: Rolling walker (2 wheeled) Transfers: Sit to/from Stand Sit to Stand: Supervision         General transfer comment: cues for sequence with assist to manage lines  Ambulation/Gait Ambulation/Gait assistance: Min guard Ambulation Distance (Feet): 500 Feet Assistive device: Rolling walker (2 wheeled) Gait Pattern/deviations: Step-through pattern;Decreased stride length;Wide base of support   Gait velocity interpretation: Below normal speed for age/gender General Gait Details: cues for safety, position in RW and breathing technique. Very light use of hands on RW   Stairs            Wheelchair Mobility    Modified Rankin (Stroke Patients Only)       Balance Overall  balance assessment: Needs assistance         Standing balance support: No upper extremity supported Standing balance-Leahy Scale: Good       Tandem Stance - Right Leg: 30 (modified tandem; feet <shoulder width)   Rhomberg - Eyes Opened: 30 Rhomberg - Eyes Closed: 30 (normal sway and corrections)        Cognition Arousal/Alertness: Awake/alert Behavior During Therapy: Flat affect Overall Cognitive Status: Impaired/Different from baseline Area of Impairment: Safety/judgement     Memory: Decreased short-term memory   Safety/Judgement: Decreased awareness of deficits     General Comments: unable to recall safe use of DME (although family was able to give proper cues to correct); made to stop and breath when SaO2 drop    Exercises      General Comments General comments (skin integrity, edema, etc.): Family present and preparing to feed pt his breakfast. Educated on benefits to pt to do as much for himself as he can. He complains re: lines attached to dominant Rt hand; arranged with plenty of "slack" and pt feeding himself on departure      Pertinent Vitals/Pain See above re: SaO2; at end of session pt on 3L eating bfast at 90% MAP sitting 66         Standing 70        Standing after walk 71  Pain Assessment: Faces Faces Pain Scale: Hurts little more Pain Location: thoracic area Pain Descriptors / Indicators: Sore (states from position lying  in) Pain Intervention(s): Limited activity within patient's tolerance;Monitored during session;Repositioned    Home Living                      Prior Function            PT Goals (current goals can now be found in the care plan section) Acute Rehab PT Goals 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 Time For Goal Achievement: 11/22/14 Progress towards PT goals: Progressing toward goals    Frequency  Min 3X/week    PT Plan Current plan remains appropriate    Co-evaluation             End of Session  Equipment Utilized During Treatment: Oxygen Activity Tolerance: Treatment limited secondary to medical complications (Comment) (dropping SaO2) Patient left: in chair;with call bell/phone within reach;with chair alarm set;with family/visitor present     Time: 0821-0850 PT Time Calculation (min) (ACUTE ONLY): 29 min  Charges:  $Gait Training: 23-37 mins                    G Codes:      Justin [name] 12/04/14, 9:08 AM  Pager [phone]

## 2014-11-11 NOTE — Consult Note (Addendum)
[name] - Stepdown/ICU TEAM CONSULT F/U NOTE  [name] ZOX:[id number] DOB: 07-Nov-1960 DOA: 2014-11-26 PCP: [name], MD  Admit HPI / Brief Narrative: 67M with DM, HTN, HLD, obesity and CAD s/p anterior wall MI (2011) with residual 3VD that was not amendeble to revascularization c/b LVSD (EF nadir 30-35%, improved to 45-50% in 2012) presented 2014-11-26 with lateral STEMI in the setting of acute pancreatitis. Admitted under PCCM and cardiology team. [facility] was consulted by cardiology team for evaluation of abd pain and pancreatitis.   HPI/Subjective: The patient is resting comfortably in a bedside chair.  He denies chest pain fevers chills nausea or vomiting.  He states he has not had a bowel movement today but did have a large bowel movement yesterday.  Recommendations/Plan:  CAD - ST elevation myocardial infarction (STEMI) of lateral wall - as per primary service (Cardiology)  Acute suspected gallstone pancreatitis - transaminitis  - lipase on admission > 3000 but has since normalized - LFT were elevated with [place] notable for cholelithiasis and gallbladder wall thickening - d/w GI on call Dr. [name], pt likely passed stone, will need referral for outpatient follow up, advised to try to schedule with Dr. [name] once acute medical issues resolved - pt will eventually need lap chole  - per GI OK to continue advancing diet as pt able to tolerate  - LFTs have now normalized   Leukocytosis - The patient is afebrile and has no localizing clinical symptoms - Cardiology has initiated an infection evaluation  Ileus - pulled out NGT 8/26 - tolerating current diet w/o difficulty  - continue bowel regimen - last BM yesterday   Acute on chronic combined systolic and diastolic CHF - management per Cardiology - EF 20-25% by echo 11/06/14  Acute hypoxic respiratory failure  - appears to be due to pulmonary edema - diuresis per Cardiology   Acute renal failure -  Cr peaked at 3.69 but has now normalized - holding stable w/ diuresis   Hypokalemia - Resolved with supplementation  Hypocalcemia - Calcium currently corrects to normal range - further supplementation not presently indicated  Hyponatremia - Essentially resolved - follow with ongoing diuresis  HLD - target LDL< 70 - was not on statin at home - Cardiology has resumed statin - following LFTs  Diabetes Mellitus 2 - 8/24 A1c 6.4 on oral medications alone - CBGs severely uncontrolled - adjust treatment and follow  Thrombocytopenia - HIT panel unrevealing - PLT count has essentially normalized   Morbid obesity - Body mass index is 42.17 kg/(m^2).  Code Status: FULL Family Communication: no family present at time of exam  Antibiotics: none  DVT prophylaxis: SQ heparin   Objective: Blood pressure 107/59, pulse 102, temperature 97.2 F (36.2 C), temperature source Oral, resp. rate 18, height 5\' 9"  (1.753 m), weight 131.362 kg (289 lb 9.6 oz), SpO2 90 %.  Intake/Output Summary (Last 24 hours) at 11/11/14 1636 Last data filed at 11/11/14 1527  Gross per 24 hour  Intake    250 ml  Output   2040 ml  Net  -1790 ml   Exam: General: No acute respiratory distress  Lungs: Improved air movement throughout all fields - no wheeze Cardiovascular: Regular rate and rhythm without murmur gallop or rub  Abdomen: Nontender, protuberant/obese, soft, bowel sounds positive, no rebound, no ascites, no appreciable mass Extremities: No significant cyanosis, clubbing;  1+ edema bilateral lower extremities  Data Reviewed: Basic Metabolic Panel:  Recent Labs Lab 11/07/14 0559 11/08/14 8119  11/09/14 0234 11/10/14 0236 11/11/14 0257  NA 132* 138 135 131* 132*  K 3.6 3.3* 4.9 4.6 5.0  CL 103 111 103 98* 97*  CO2 20* 19* 27 26 27   GLUCOSE 162* 150* 189* 245* 266*  BUN 63* 29* 24* 25* 23*  CREATININE 1.38* 0.74 1.01 1.02 1.08  CALCIUM 6.6* 5.7* 7.8* 7.8* 7.8*  MG 1.9 2.0 2.6*  --   --      CBC:  Recent Labs Lab 11/07/14 0559 11/08/14 0644 11/09/14 0234 11/10/14 0236 11/11/14 0257  WBC 9.0 8.1 9.7 15.3* 18.8*  NEUTROABS 8.2* 7.6 9.2* 12.2* 16.2*  HGB 12.8* 11.1* 13.7 14.3 13.0  HCT 37.0* 32.5* 41.0 41.8 38.6*  MCV 88.9 91.5 92.6 92.3 93.0  PLT 110* 118* 151 150 201    Liver Function Tests:  Recent Labs Lab 11/05/14 0540 11/06/14 0516 11/08/14 0644 11/09/14 0234 11/10/14 0236  AST 236* 92* 32 39 39  ALT 250* 143* 50 50 38  ALKPHOS 52 50 41 59 69  BILITOT 3.9* 1.9* 1.5* 1.6* 1.6*  PROT 6.1* 6.6 4.6* 6.0* 5.8*  ALBUMIN 2.9* 2.8* 1.8* 2.2* 2.0*    Recent Labs Lab 11/05/14 0540 11/07/14 0559 11/08/14 0644 11/09/14 0234 11/11/14 1119  LIPASE 702* 52* 22 27 35  AMYLASE  --   --   --   --  59   Cardiac Enzymes:  Recent Labs Lab 11/06/14 1923  11/07/14 0559 11/07/14 1128 11/07/14 1655 11/07/14 2329 11/08/14 0644  CKTOTAL 497*  --   --   --   --   --   --   TROPONINI 13.76*  < > 10.15* 8.41* 8.30* 4.68* 4.41*  < > = values in this interval not displayed.  CBG:  Recent Labs Lab 11/10/14 1132 11/10/14 1649 11/10/14 2205 11/11/14 0759 11/11/14 1156  GLUCAP 252* 301* 276* 250* 320*    Recent Results (from the past 240 hour(s))  MRSA PCR Screening     Status: None   Collection Time: 10/26/2014 11:17 PM  Result Value Ref Range Status   MRSA by PCR NEGATIVE NEGATIVE Final    Comment:        The GeneXpert MRSA Assay (FDA approved for NASAL specimens only), is one component of a comprehensive MRSA colonization surveillance program. It is not intended to diagnose MRSA infection nor to guide or monitor treatment for MRSA infections.      Studies:   Recent x-ray studies have been reviewed in detail by the Attending Physician  Scheduled Meds:  Scheduled Meds: . antiseptic oral rinse  7 mL Mouth Rinse BID  . aspirin  81 mg Oral Daily  . atorvastatin  20 mg Oral q1800  . carvedilol  37.5 mg Oral BID WC  . furosemide  20 mg  Oral Daily  . [START ON 11/12/2014] glimepiride  4 mg Oral Daily  . heparin subcutaneous  5,000 Units Subcutaneous 3 times per day  . insulin aspart  0-20 Units Subcutaneous TID WC  . insulin glargine  30 Units Subcutaneous QHS  . irbesartan  37.5 mg Oral BID  . isosorbide mononitrate  30 mg Oral Daily  . polyethylene glycol  17 g Oral Daily  . senna-docusate  1 tablet Oral BID  . simethicone  40 mg Oral TID  . ticagrelor  90 mg Oral BID  . white petrolatum        Time spent on care of this patient: 25 mins   MCCLUNG,JEFFREY T , MD   [facility] Office  [phone] Pager - Text Page per Loretha Stapler as per below:  On-Call/Text Page:      [email or web address]      password [email or web address]  If 7PM-7AM, please contact night-coverage [email or web address] Password [email or web address] 11/11/2014, 4:36 PM   LOS: 8 days

## 2014-11-11 NOTE — Progress Notes (Signed)
CARDIAC REHAB PHASE I   PRE:  Rate/Rhythm: 104 ST  BP:  Sitting: 107/89        SaO2: 90 4L  MODE:  Ambulation: 700 ft   POST:  Rate/Rhythm: 103 ST  BP:  Sitting: 85/62 R arm, 106/56 L arm         SaO2: 95 4L  Pt ambulated 700 ft on 4L O2, rolling walker, assist x1, steady gait, tolerated well. Pt denies CP, dizziness, DOE, declined rest stop. Pt did perform pursed lip breathing throughout ambulation. Pt sats registering 88-90% on 4L on room sensor/monitor, 93-95% on 4L on portable O2 sat machine. RN notified. Pt to recliner after walk, call bell within reach, sisters at bedside.   [id number]   Joylene Grapes, RN, BSN 11/11/2014 12:44 PM

## 2014-11-12 DIAGNOSIS — T82857A Stenosis of cardiac prosthetic devices, implants and grafts, initial encounter: Secondary | ICD-10-CM

## 2014-11-12 LAB — COMPREHENSIVE METABOLIC PANEL
ALT: 29 U/L (ref 17–63)
ANION GAP: 7 (ref 5–15)
AST: 34 U/L (ref 15–41)
Albumin: 1.7 g/dL — ABNORMAL LOW (ref 3.5–5.0)
Alkaline Phosphatase: 66 U/L (ref 38–126)
BUN: 26 mg/dL — AB (ref 6–20)
CHLORIDE: 95 mmol/L — AB (ref 101–111)
CO2: 26 mmol/L (ref 22–32)
Calcium: 7.7 mg/dL — ABNORMAL LOW (ref 8.9–10.3)
Creatinine, Ser: 1.12 mg/dL (ref 0.61–1.24)
GFR calc Af Amer: >60 mL/min (ref >60)
Glucose, Bld: 298 mg/dL — ABNORMAL HIGH (ref 65–99)
POTASSIUM: 4.6 mmol/L (ref 3.5–5.1)
Sodium: 128 mmol/L — ABNORMAL LOW (ref 135–145)
Total Bilirubin: 1.3 mg/dL — ABNORMAL HIGH (ref 0.3–1.2)
Total Protein: 6.8 g/dL (ref 6.5–8.1)

## 2014-11-12 LAB — CBC WITH DIFFERENTIAL/PLATELET
BASOS ABS: 0 10*3/uL (ref 0.0–0.1)
Basophils Relative: 0 % (ref 0–1)
EOS PCT: 1 % (ref 0–5)
Eosinophils Absolute: 0.1 10*3/uL (ref 0.0–0.7)
HCT: 39.4 % (ref 39.0–52.0)
Hemoglobin: 13.1 g/dL (ref 13.0–17.0)
LYMPHS ABS: 0.9 10*3/uL (ref 0.7–4.0)
Lymphocytes Relative: 6 % — ABNORMAL LOW (ref 12–46)
MCH: 30.8 pg (ref 26.0–34.0)
MCHC: 33.2 g/dL (ref 30.0–36.0)
MCV: 92.7 fL (ref 78.0–100.0)
MONO ABS: 0.9 10*3/uL (ref 0.1–1.0)
Monocytes Relative: 6 % (ref 3–12)
Neutro Abs: 12.8 10*3/uL — ABNORMAL HIGH (ref 1.7–7.7)
Neutrophils Relative %: 87 % — ABNORMAL HIGH (ref 43–77)
PLATELETS: 165 10*3/uL (ref 150–400)
RBC: 4.25 MIL/uL (ref 4.22–5.81)
RDW: 14 % (ref 11.5–15.5)
WBC Morphology: INCREASED
WBC: 14.7 10*3/uL — AB (ref 4.0–10.5)

## 2014-11-12 LAB — GLUCOSE, CAPILLARY
GLUCOSE-CAPILLARY: 293 mg/dL — AB (ref 65–99)
GLUCOSE-CAPILLARY: 202 mg/dL — AB (ref 65–99)
GLUCOSE-CAPILLARY: 166 mg/dL — AB (ref 65–99)
Glucose-Capillary: 326 mg/dL — ABNORMAL HIGH (ref 65–99)

## 2014-11-12 MED ORDER — INSULIN ASPART 100 UNIT/ML ~~LOC~~ SOLN
10.0000 [IU] | Freq: Three times a day (TID) | SUBCUTANEOUS | Status: DC
  Administered 2014-11-12 – 2014-11-15 (×9): 10 [IU] via SUBCUTANEOUS

## 2014-11-12 MED ORDER — CARVEDILOL 25 MG PO TABS
25.0000 mg | ORAL_TABLET | Freq: Two times a day (BID) | ORAL | Status: DC
  Administered 2014-11-12 – 2014-11-14 (×4): 25 mg via ORAL
  Filled 2014-11-12 (×4): qty 1

## 2014-11-12 MED ORDER — IRBESARTAN 75 MG PO TABS
37.5000 mg | ORAL_TABLET | Freq: Two times a day (BID) | ORAL | Status: DC
  Filled 2014-11-12 (×2): qty 0.5

## 2014-11-12 MED ORDER — ATORVASTATIN CALCIUM 40 MG PO TABS
40.0000 mg | ORAL_TABLET | Freq: Every day | ORAL | Status: DC
  Administered 2014-11-12 – 2014-11-15 (×4): 40 mg via ORAL
  Filled 2014-11-12 (×4): qty 1

## 2014-11-12 MED ORDER — SPIRONOLACTONE 25 MG PO TABS
12.5000 mg | ORAL_TABLET | Freq: Once | ORAL | Status: AC
  Administered 2014-11-12: 12.5 mg via ORAL
  Filled 2014-11-12: qty 1

## 2014-11-12 MED ORDER — INSULIN GLARGINE 100 UNIT/ML ~~LOC~~ SOLN
30.0000 [IU] | Freq: Every day | SUBCUTANEOUS | Status: DC
  Administered 2014-11-12: 30 [IU] via SUBCUTANEOUS
  Filled 2014-11-12 (×2): qty 0.3

## 2014-11-12 MED ORDER — FUROSEMIDE 10 MG/ML IJ SOLN
40.0000 mg | Freq: Once | INTRAMUSCULAR | Status: AC
  Administered 2014-11-12: 40 mg via INTRAVENOUS
  Filled 2014-11-12: qty 4

## 2014-11-12 MED ORDER — IRBESARTAN 75 MG PO TABS
37.5000 mg | ORAL_TABLET | Freq: Every day | ORAL | Status: DC
Start: 2014-11-12 — End: 2014-11-13
  Administered 2014-11-12: 37.5 mg via ORAL
  Filled 2014-11-12 (×2): qty 0.5

## 2014-11-12 NOTE — Progress Notes (Signed)
Patient: Justin [name] / Admit Date: 10/29/2014 / Date of Encounter: 11/12/2014, 11:09 AM   Subjective: I feel "halfway" - no CP or SOB, but does report lower extremities feel more swollen.   Objective: Telemetry: NSR/sinus tach, rare PVC Physical Exam: Blood pressure 88/59, pulse 96, temperature 97.8 F (36.6 C), temperature source Oral, resp. rate 20, height 5\' 9"  (1.753 m), weight 292 lb 3.2 oz (132.541 kg), SpO2 93 %. General: Well developed obese WM in no acute distress. Head: Normocephalic, atraumatic, sclera non-icteric, no xanthomas, nares are without discharge. Neck: Negative for carotid bruits. JVP not elevated. Lungs: Diffusely diminished without wheezes, rales, or rhonchi. Breathing is unlabored. Heart: RRR S1 S2 without murmurs, rubs, or gallops.  Abdomen: Soft, non-tender, rounded with normoactive bowel sounds. No rebound/guarding. Extremities: No clubbing or cyanosis. Trace bilateral edema superimposed on large leg habitus. Distal pedal pulses are 2+ and equal bilaterally. Neuro: Alert and oriented X 3. Moves all extremities spontaneously. Psych:  Responds to questions appropriately with a normal affect.   Intake/Output Summary (Last 24 hours) at 11/12/14 1109 Last data filed at 11/12/14 0900  Gross per 24 hour  Intake    620 ml  Output    900 ml  Net   -280 ml    Inpatient Medications:  . antiseptic oral rinse  7 mL Mouth Rinse BID  . aspirin  81 mg Oral Daily  . atorvastatin  20 mg Oral q1800  . carvedilol  37.5 mg Oral BID WC  . furosemide  20 mg Oral Daily  . glimepiride  4 mg Oral QAC breakfast  . heparin subcutaneous  5,000 Units Subcutaneous 3 times per day  . insulin aspart  0-20 Units Subcutaneous TID WC  . insulin glargine  16 Units Subcutaneous QHS  . irbesartan  37.5 mg Oral BID  . isosorbide mononitrate  30 mg Oral Daily  . metFORMIN  1,000 mg Oral BID WC  . polyethylene glycol  17 g Oral BID  . senna-docusate  1 tablet Oral BID  . simethicone   40 mg Oral TID  . ticagrelor  90 mg Oral BID   Infusions:  . sodium chloride Stopped (11/06/14 0700)    Labs:  Recent Labs  11/11/14 0257 11/12/14 0255  NA 132* 128*  K 5.0 4.6  CL 97* 95*  CO2 27 26  GLUCOSE 266* 298*  BUN 23* 26*  CREATININE 1.08 1.12  CALCIUM 7.8* 7.7*    Recent Labs  11/10/14 0236 11/12/14 0255  AST 39 34  ALT 38 29  ALKPHOS 69 66  BILITOT 1.6* 1.3*  PROT 5.8* 6.8  ALBUMIN 2.0* 1.7*    Recent Labs  11/11/14 0257 11/12/14 0255  WBC 18.8* 14.7*  NEUTROABS 16.2* 12.8*  HGB 13.0 13.1  HCT 38.6* 39.4  MCV 93.0 92.7  PLT 201 165   No results for input(s): CKTOTAL, CKMB, TROPONINI in the last 72 hours. Invalid input(s): POCBNP No results for input(s): HGBA1C in the last 72 hours.   Radiology/Studies:  Ct Abdomen Pelvis Wo Contrast  10/15/2014   CLINICAL DATA:  Patient with history of CHF and coronary artery disease presenting with sudden onset mid abdominal pain for 6 hours.  EXAM: CT CHEST, ABDOMEN AND PELVIS WITHOUT CONTRAST  TECHNIQUE: Multidetector CT imaging of the chest, abdomen and pelvis was performed following the standard protocol without IV contrast.  COMPARISON:  None.  FINDINGS: CT CHEST FINDINGS  Mediastinum/Nodes: Normal heart size. No pericardial effusion. Dense coronary arterial vascular calcifications. No  axillary, mediastinal or hilar lymphadenopathy.  Lungs/Pleura: Central airways are patent. Depending ground-glass opacities within the bilateral lower lobes most compatible with atelectasis. No large pleural effusion. No pneumothorax.  CT ABDOMEN AND PELVIS FINDINGS  Hepatobiliary: The liver is normal in size and contour. Multiple high-density stones are demonstrated within the gallbladder lumen. No gallbladder wall thickening or pericholecystic fluid. No intrahepatic or extrahepatic biliary ductal dilatation.  Pancreas: There is peripancreatic fat stranding and thin fluid demonstrated. Fluid courses along the anterior right renal  fascia. Unable to evaluate for pancreatic necrosis without IV contrast.  Spleen: Unremarkable  Adrenals/Urinary Tract: The adrenal glands are normal. There is mild bilateral perinephric fat stranding. Fullness within the renal pelvis bilaterally which is nonspecific however may represent multiple bilateral parapelvic cysts. The urinary bladder is unremarkable.  Stomach/Bowel: Normal appendix. No abnormal bowel wall thickening or evidence for bowel obstruction. Probable duodenum diverticulum.  Vascular/Lymphatic: Normal caliber abdominal aorta. No retroperitoneal lymphadenopathy.  Other: Prostate unremarkable. Bilateral fat containing inguinal hernias, left-greater-than-right.  Musculoskeletal: No aggressive or acute appearing osseous lesions.  IMPRESSION: Fat stranding and fluid about the pancreas compatible with acute pancreatitis. Unable to assess for necrosis without IV contrast.  Cholelithiasis without CT evidence for acute cholecystitis.   Electronically Signed   By: Annia Belt M.D.   On: 10/26/2014 20:56   Dg Abd 1 View  11/11/2014   CLINICAL DATA:  Abdominal distention and back pain with acute pancreatitis.  EXAM: ABDOMEN - 1 VIEW  COMPARISON:  11/04/2014 and CT 11/07/2014  FINDINGS: Exam demonstrates moderate gaseous distention of the stomach slightly improved. Bowel gas pattern is otherwise unremarkable without obstruction. There are mild degenerative changes of the spine. Remainder the exam is unchanged.  IMPRESSION: Nonobstructive bowel gas pattern. Less gaseous distention of the stomach.   Electronically Signed   By: Elberta Fortis M.D.   On: 11/11/2014 17:20   Ct Chest Wo Contrast  10/22/2014   CLINICAL DATA:  Patient with history of CHF and coronary artery disease presenting with sudden onset mid abdominal pain for 6 hours.  EXAM: CT CHEST, ABDOMEN AND PELVIS WITHOUT CONTRAST  TECHNIQUE: Multidetector CT imaging of the chest, abdomen and pelvis was performed following the standard protocol  without IV contrast.  COMPARISON:  None.  FINDINGS: CT CHEST FINDINGS  Mediastinum/Nodes: Normal heart size. No pericardial effusion. Dense coronary arterial vascular calcifications. No axillary, mediastinal or hilar lymphadenopathy.  Lungs/Pleura: Central airways are patent. Depending ground-glass opacities within the bilateral lower lobes most compatible with atelectasis. No large pleural effusion. No pneumothorax.  CT ABDOMEN AND PELVIS FINDINGS  Hepatobiliary: The liver is normal in size and contour. Multiple high-density stones are demonstrated within the gallbladder lumen. No gallbladder wall thickening or pericholecystic fluid. No intrahepatic or extrahepatic biliary ductal dilatation.  Pancreas: There is peripancreatic fat stranding and thin fluid demonstrated. Fluid courses along the anterior right renal fascia. Unable to evaluate for pancreatic necrosis without IV contrast.  Spleen: Unremarkable  Adrenals/Urinary Tract: The adrenal glands are normal. There is mild bilateral perinephric fat stranding. Fullness within the renal pelvis bilaterally which is nonspecific however may represent multiple bilateral parapelvic cysts. The urinary bladder is unremarkable.  Stomach/Bowel: Normal appendix. No abnormal bowel wall thickening or evidence for bowel obstruction. Probable duodenum diverticulum.  Vascular/Lymphatic: Normal caliber abdominal aorta. No retroperitoneal lymphadenopathy.  Other: Prostate unremarkable. Bilateral fat containing inguinal hernias, left-greater-than-right.  Musculoskeletal: No aggressive or acute appearing osseous lesions.  IMPRESSION: Fat stranding and fluid about the pancreas compatible with acute pancreatitis. Unable  to assess for necrosis without IV contrast.  Cholelithiasis without CT evidence for acute cholecystitis.   Electronically Signed   By: Annia Belt M.D.   On: 10/26/2014 20:56   Dg Chest Port 1 View  11/04/2014   CLINICAL DATA:  Hypoxia and shortness of breath.  EXAM:  PORTABLE CHEST - 1 VIEW  COMPARISON:  Chest CT 1 day prior.  FINDINGS: Lung volumes are low leading to accentuation of the cardiac size and crowding of bronchovascular structures. Probable bilateral perihilar atelectasis. No large pleural effusion or pneumothorax. Detailed evaluation limited by soft tissue attenuation from body habitus and low lung volumes.  IMPRESSION: Hypoventilatory chest with probable perihilar atelectasis.   Electronically Signed   By: Rubye Oaks M.D.   On: 11/04/2014 02:45   Dg Chest Port 1v Same Day  11/11/2014   CLINICAL DATA:  Shortness of breath, CHF, history diabetes mellitus, coronary artery disease post MI  EXAM: PORTABLE CHEST - 1 VIEW SAME DAY  COMPARISON:  Portable exam 1042 hours compared 11/07/2014  FINDINGS: Enlargement of cardiac silhouette.  Mediastinal contours and pulmonary vascularity normal.  Low lung volumes with bibasilar atelectasis greater on LEFT.  No infiltrate, pleural effusion or pneumothorax.  IMPRESSION: Enlargement of cardiac silhouette with bibasilar atelectasis.   Electronically Signed   By: Ulyses Southward M.D.   On: 11/11/2014 10:52   Dg Chest Port 1v Same Day  11/07/2014   CLINICAL DATA:  Dyspnea, STEMI, coronary artery disease, hypertension, diabetes mellitus, acute pancreatitis  EXAM: PORTABLE CHEST - 1 VIEW SAME DAY  COMPARISON:  Portable exam 1142 hours compared 11/04/2014  FINDINGS: Enlargement of cardiac silhouette.  Mediastinal contours pulmonary vascularity grossly normal for technique.  Low lung volumes with bibasilar atelectasis.  Upper lungs clear.  No gross pleural effusion or pneumothorax.  IMPRESSION: Enlargement of cardiac silhouette.  Low lung volumes with bibasilar atelectasis.   Electronically Signed   By: Ulyses Southward M.D.   On: 11/07/2014 12:56   Dg Abd Portable 1v  11/04/2014   CLINICAL DATA:  Abdominal pain and distension, recent vomiting  EXAM: PORTABLE ABDOMEN - 1 VIEW  COMPARISON:  None.  FINDINGS: Stomach is distended with  air. Scattered large and small bowel gas is noted. There is some suggestion of mildly dilated small bowel loops although incompletely evaluated on this supine study.  IMPRESSION: Stomach dilated with air.  There is some suggestion of mildly dilated small bowel loops.   Electronically Signed   By: Alcide Clever M.D.   On: 11/04/2014 12:13   US Abdomen Limited Ruq  11/04/2014   CLINICAL DATA:  Upper abdominal pain and pancreatitis  EXAM: US ABDOMEN LIMITED - RIGHT UPPER QUADRANT  COMPARISON:  10/29/2014  FINDINGS: Gallbladder:  Multiple gallstones are noted. Mild wall thickening at 3.9 mm is noted. Some of this may be reactive in nature given the known pancreatitis.  Common bile duct:  Diameter: 7 mm  Liver:  Diffuse increased echogenicity is noted likely related to fatty infiltration. A small focus of decreased echogenicity is noted adjacent to the gallbladder which may be a small amount of pericholecystic fluid or focal fatty sparing.  IMPRESSION: Cholelithiasis with mild gallbladder wall thickening.  Fatty liver.  Area of decreased attenuation adjacent to the gallbladder fossa. This may be related to an area of focal fatty sparing or small amount of pericholecystic fluid.   Electronically Signed   By: Alcide Clever M.D.   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   1. CAD with lateral STEMI s/p POBA of prox and mid LAD  - initial cath: had lots of thrombus within the stents. Dr. Herbie Baltimore was not able to open the distal LAD - it was too diffusely diseased. POBA only performed due to need to be NPO given pancreatitis.  - continue ASA, BB, Brilinta, lower dose statin (given hepatobiliary issues - can titrate later on if able) - per Dr. Elissa Hefty  cath note, "He will likely require PCI with a new stent in the intervening segment between the ostial and mid LAD stents. However this can be performed once he has recovered from his pancreatitis." Will review timing with MD  2. Acute pancreatitis complicated by ileus, hypocalcemia - lipase on admission > 3000, now normal - LFT's also elevated with [place] notable for cholelithiasis and gallbladder wall thickening - IM d/w GI on call Dr. [name], pt likely passed stone, will need referral for outpatient follow up, will need to try to schedule with Dr. [name] once acute medical issues resolved and pt will eventually need lap chole   3. Persistent hypoxia  - high O2 requirements ever since admission with borderline sinus tach - now with dropping sodium as well - corrects to 131 for glucose - weight continues to rise - will discuss further management with Dr. Tresa Endo  4. Acute on chronic combined CHF - EF 20-25% by echo 11/06/14 - hypotension is now prohibiting BB, ARB - will hold ARB and decrease BB for next dose - may need to consider LifeVest this admission - see above  5. Acute kidney injury - peak Cr 3.6 - improved  6. Hypertension, see above 7. Hyperlipidemia, see above 8. Intermittent delirium, maybe due to toxic metabolic encephalopathy, improved 9. Diabetes mellitus - per IM  Signed, Ronie Spies PA-C Pager: [phone]     Patient seen and examined. Agree with assessment and plan. No chest pain. BP low this am; now 100/56;  HR now 96. Basilar atelectasis. Will resume irbesartan at 37.5 mg daily.  Will give iv lasix 40 mg. Increase atorvastatin to 40 mg. Continue carvedilol but will reduce back to 25 mg bid. Start spironolactone 12.5 mg daily today. F/u Bmet, BNP in am.   Lennette Bihari, MD, [facility] 11/12/2014 11:48 AM

## 2014-11-12 NOTE — Progress Notes (Signed)
Inpatient Diabetes Program Recommendations  AACE/ADA: New Consensus Statement on Inpatient Glycemic Control (2013)  Target Ranges:  Prepandial:   less than 140 mg/dL      Peak postprandial:   less than 180 mg/dL (1-2 hours)      Critically ill patients:  140 - 180 mg/dL   Note:  To get first dose of Amaryl 4 mg this am and a second dose of metformin.  Fasting glucose 293 this am.  Will need to recheck glucose around lunch and dinner time to determine the extent in which Amaryl and Metformin will manage glucose levels.  May still need to increase basal insulin since fasting glucose was so high.  Thanks,  Christena Deem RN, MSN, [facility] Inpatient Diabetes Coordinator Team Pager [phone]

## 2014-11-12 NOTE — Progress Notes (Signed)
CARDIAC REHAB PHASE I   PRE:  Rate/Rhythm: 101 ST  BP:  Supine:   Sitting: 101/59  Standing:    SaO2: 97% 6L  MODE:  Ambulation: 700 ft   POST:  Rate/Rhythm: 108 ST  BP:  Supine:   Sitting: 86/42, 100/60  Standing:    SaO2: 93% 4L hall, 94% room 1330-1400 Pt walked 700 ft on 4L with rolling walker and asst x 1 with steady gait. Stopped once and I checked his sats at 93% on 4L. Pt did not feel like he could increase distance today. Left on 4L. To recliner with chair alarm and call light. Denied dizziness with Bp of 86/42.   Luetta Nutting, RN BSN  11/12/2014 2:10 PM

## 2014-11-12 NOTE — Consult Note (Signed)
[facility] Medical Consultation  Justin [name] WUJ:[id number] DOB: 12/29/1960 DOA: 10/19/2014 PCP: [name], MD   Requesting physician: Dr. Elease Hashimoto  Date of consultation: 11/04/14 Reason for consultation: Abd pain, Diabetes    Impression/Recommendations Principal Problem:   ST elevation myocardial infarction (STEMI) of lateral wall, initial episode of care Active Problems:   Hyperlipidemia with target LDL less than 70   CAD S/P percutaneous coronary angioplasty: DES x 2 to mid-dLAD, x 1 to ostial-prox LAD   Chronic combined systolic and diastolic congestive heart failure   Acute pancreatitis   Coronary stent thrombosis   Essential hypertension   Type 2 diabetes mellitus without complication   Hypoxia   Abdominal distension   Diabetes type 2, controlled   Abdominal pain   Cholelithiasis with cholecystitis of other acuity   Acute renal failure syndrome   Thrombocytopenia   Systolic and diastolic CHF, chronic   HLD (hyperlipidemia)   Gallstone pancreatitis   Ileus    ST elevation myocardial infarction (STEMI) of lateral wall: -s/p POBA to prox and mid LAD -further care per cardiology service -patient on ASA, COREG, ACE, Aldactone and brillinta  -Echocardiogram; see results below   Chronic combined systolic and diastolic congestive heart failure -per cardiology service -appears to be compensated currently   HLD -target LDL< 70 -was not on statins at home -holding statin currently given transaminitis  Acute suspected gallstone pancreatitis - transaminitis/abdominal pain  - lipase on admission > 3000 but has since normalized - LFT were elevated with [place] notable for cholelithiasis and gallbladder wall thickening - d/w GI on call Dr. [name], pt likely passed stone, will need referral for outpatient follow up, advised to try to schedule with Dr. [name] once acute medical issues resolved - pt will eventually need lap chole  - per GI OK to continue  advancing diet as pt able to tolerate  - LFTs have now normalized  -DO NOT RESTART METFORMIN WITHOUT CONSULTING PHYSICIAN. PT RECOVERING FROM ACUTE LIVER FAILURE   Ileus - pulled out NGT 8/26 - tolerating current diet w/o difficulty  - continue bowel regimen   Diabetes Mellitus type 2 controlled  -8/24 hemoglobin A1c = 6.4   -Increase Lantus to 30 units daily -Continue resistant SSI -Restart NovoLog10 units QAC  Acute renal failure -Most likely multifactorial to include STEMI, and contrast load  -DO NOT RESTART METFORMIN WITHOUT CONSULTING PHYSICIAN. PT RECOVERING FROM RENAL FAILURE  Thrombocytopenia -Hit panel normal    I will followup again tomorrow. Please contact me if I can be of assistance in the meanwhile. Thank you for this consultation.  Chief Complaint: Multiple medical problems  HPI:  52 WM PMHx DM Type 2, HTN, systolic CHF, CAD s/p anterior wall MI (2011) with residual 3VD that was not amendeble to revascularization c/b LVSD (EF nadir 30-35%, improved to 45-50% in 2012) HLD, obesity.  Presents with lateral STEMI in the setting of pancreatitis.   Review of symptoms The patient denies anorexia, fever, weight loss,, vision loss, decreased hearing, hoarseness, chest pain, syncope, dyspnea on exertion, peripheral edema, balance deficits, hemoptysis, abdominal pain, melena, hematochezia, severe indigestion/heartburn, hematuria, incontinence, genital sores, muscle weakness, suspicious skin lesions, transient blindness, difficulty walking, depression, unusual weight change, abnormal bleeding, enlarged lymph nodes, angioedema, and breast masses.   Consultants:   Procedure/Significant Events: 8/26 echocardiogram;LVEF= 20% to 25%. Akinesis of the entireapical myocardium/apical segments of the anterior, lateral, inferolateral, septal and inferior myocardium. Akinesis of the midanterolateral myocardium. Akinesis of the midanterior, inferolateral, and inferior  myocardium.  Culture    Antibiotics:   DVT prophylaxis:    Past Medical History  Diagnosis Date  . CHF 12/14/2009    EF 35% at the time of MI.  50% echo 6/12  . CAD S/P percutaneous coronary angioplasty     November 14, 2009 anterior MI LAD 80% ostial, 70% proximal followed by total occlusion, ramus intermediate total occlusion circumflex 80%, PDA 90% lesions. Overlapping drug-eluting stents to the LAD  . ST elevation myocardial infarction (STEMI) of anterior wall 11/13/2009  . Diabetes mellitus   . HTN (hypertension)   . Hyperlipidemia   . Diabetes mellitus   . Overweight    Past Surgical History  Procedure Laterality Date  . Cardiac catheterization N/A 11/14/14    Procedure: Left Heart Cath and Coronary Angiography;  Surgeon: Marykay Lex, MD;  Location: [facility] INVASIVE CV LAB;  Service: Cardiovascular;  Laterality: N/A;  . Cardiac catheterization N/A 2014/11/14    Procedure: Coronary Stent Intervention;  Surgeon: Marykay Lex, MD;  Location: [facility] INVASIVE CV LAB;  Service: Cardiovascular;  Laterality: N/A;   Social History:  reports that he has never smoked. He has never used smokeless tobacco. He reports that he does not drink alcohol. His drug history is not on file.  No Known Allergies Family History  Problem Relation Age of Onset  . Coronary artery disease    . Heart attack    . Cardiomyopathy    . Heart disease      Prior to Admission medications   Medication Sig Start Date End Date Taking? Authorizing Provider  acetaminophen (TYLENOL) 325 MG tablet Take 650 mg by mouth every 6 (six) hours as needed.     Yes Historical Provider, MD  amLODipine (NORVASC) 5 MG tablet Take 5 mg by mouth daily.  05/19/13  Yes Historical Provider, MD  aspirin 325 MG tablet Take 325 mg by mouth daily.     Yes Historical Provider, MD  carvedilol (COREG) 12.5 MG tablet Take 12.5 mg by mouth 2 (two) times daily with a meal.   Yes Historical Provider, MD  enalapril (VASOTEC) 10 MG tablet  Take 1 tablet (10 mg total) by mouth 2 (two) times daily. 06/27/10  Yes Rollene Rotunda, MD  furosemide (LASIX) 20 MG tablet Take 20 mg by mouth daily.   Yes Historical Provider, MD  glimepiride (AMARYL) 4 MG tablet Take 4 mg by mouth daily.  05/19/13  Yes Historical Provider, MD  isosorbide mononitrate (IMDUR) 60 MG 24 hr tablet Take 60 mg by mouth daily.   Yes Historical Provider, MD  metFORMIN (GLUCOPHAGE) 1000 MG tablet Take 1,000 mg by mouth 2 (two) times daily with a meal.   Yes Historical Provider, MD  nitroGLYCERIN (NITROSTAT) 0.4 MG SL tablet Place 1 tablet (0.4 mg total) under the tongue every 5 (five) minutes as needed. 07/10/13  Yes Rollene Rotunda, MD  NON FORMULARY Take 1 tablet by mouth daily. Study Drug   Yes Historical Provider, MD  spironolactone (ALDACTONE) 25 MG tablet Take 12.5 mg by mouth daily.   Yes Historical Provider, MD  HYDROcodone-acetaminophen (NORCO) 5-325 MG per tablet Take 1-2 tablets by mouth every 4 (four) hours as needed. 11/03/13   Blane Ohara, MD  HYDROcodone-acetaminophen (NORCO/VICODIN) 5-325 MG per tablet Take 1 tablet by mouth every 4 (four) hours as needed. 11/18/13   Ivery Quale, PA-C  meloxicam (MOBIC) 15 MG tablet Take 1 tablet (15 mg total) by mouth daily. 11/18/13   Ivery Quale, PA-C  naproxen (NAPROSYN) 500  MG tablet Take 1 tablet (500 mg total) by mouth 2 (two) times daily. 11/03/13   Blane Ohara, MD   Physical Exam: Blood pressure 100/56, pulse 96, temperature 98.1 F (36.7 C), temperature source Oral, resp. rate 18, height  (1.753 m), weight 132.541 kg (292 lb 3.2 oz), SpO2 95 %. Filed Vitals:   11/11/14 2312 11/12/14 0322 11/12/14 0740 11/12/14 1149  BP: 100/56  Pulse: 93 96 96 96  Temp: 98.2 F (36.8 C) 98.8 F (37.1 C) 97.8 F (36.6 C) 98.1 F (36.7 C)  TempSrc: Oral Oral Oral Oral  Resp: Height:      Weight:  132.541 kg (292 lb 3.2 oz)    SpO2: 92% 90% 93% 95%      General: A/OX4, negative  abdominal pain, negative N/V, negative CP. Sitting in chair comfortably  Eyes: PERRL, no icterus, no nystagmus  ENT: no erythema or exudates inside his mouth; [place] in place; no drainage out of ears or nostrils   Neck: no thyromegaly, no bruits, short thick neck   Cardiovascular: S1 and S2, no rubs or gallops, no murmurs   Respiratory: CTA bilaterally   Abdomen: Positive bowel sounds, distended, negative tenderness to palpation in epigastric region mainly   Skin: no open wounds, no petechiae, no rash  Musculoskeletal: no joint swelling appreciated,   Psychiatric: stable mood, no hallucinations   Neurologic: no focal deficit appreciated   Labs on Admission:  Basic Metabolic Panel:  Recent Labs Lab 11/07/14 0559 11/08/14 0644 11/09/14 0234 11/10/14 0236 11/11/14 0257 11/12/14 0255  NA 132* 138 135 131* 132* 128*  K 3.6 3.3* 4.9 4.6 5.0 4.6  CL 103 111 103 98* 97* 95*  CO2 20* 19* GLUCOSE 162* 150* 189* 245* 266* 298*  BUN 63* 29* 24* 25* 23* 26*  CREATININE 1.38* 0.74 1.01 1.02 1.08 1.12  CALCIUM 6.6* 5.7* 7.8* 7.8* 7.8* 7.7*  MG 1.9 2.0 2.6*  --   --   --    Liver Function Tests:  Recent Labs Lab 11/06/14 0516 11/08/14 0644 11/09/14 0234 11/10/14 0236 11/12/14 0255  AST 92* 32 39 39 34  ALT 143* 50 50 38 29  ALKPHOS 50 41 59 69 66  BILITOT 1.9* 1.5* 1.6* 1.6* 1.3*  PROT 6.6 4.6* 6.0* 5.8* 6.8  ALBUMIN 2.8* 1.8* 2.2* 2.0* 1.7*    Recent Labs Lab 11/07/14 0559 11/08/14 0644 11/09/14 0234 11/11/14 1119  LIPASE 52* 22 27 35  AMYLASE  --   --   --  59   No results for input(s): AMMONIA in the last 168 hours. CBC:  Recent Labs Lab 11/08/14 0644 11/09/14 0234 11/10/14 0236 11/11/14 0257 11/12/14 0255  WBC 8.1 9.7 15.3* 18.8* 14.7*  NEUTROABS 7.6 9.2* 12.2* 16.2* 12.8*  HGB 11.1* 13.7 14.3 13.0 13.1  HCT 32.5* 41.0 41.8 38.6* 39.4  MCV 91.5 92.6 92.3 93.0 92.7  PLT 118* 151 150 201 165   Cardiac Enzymes:  Recent Labs Lab  11/06/14 1923  11/07/14 0559 11/07/14 1128 11/07/14 1655 11/07/14 2329 11/08/14 0644  CKTOTAL 497*  --   --   --   --   --   --   TROPONINI 13.76*  < > 10.15* 8.41* 8.30* 4.68* 4.41*  < > = values in this interval not displayed. BNP: Invalid input(s): POCBNP CBG:  Recent Labs Lab 11/11/14 1156 11/11/14 1644 11/11/14 2131 11/12/14 0743 11/12/14 1204  GLUCAP 320*  203* 250* 293* 326*    Radiological Exams on Admission: Dg Abd 1 View  11/11/2014   CLINICAL DATA:  Abdominal distention and back pain with acute pancreatitis.  EXAM: ABDOMEN - 1 VIEW  COMPARISON:  11/04/2014 and CT 11/02/2014  FINDINGS: Exam demonstrates moderate gaseous distention of the stomach slightly improved. Bowel gas pattern is otherwise unremarkable without obstruction. There are mild degenerative changes of the spine. Remainder the exam is unchanged.  IMPRESSION: Nonobstructive bowel gas pattern. Less gaseous distention of the stomach.   Electronically Signed   By: Elberta Fortis M.D.   On: 11/11/2014 17:20   Dg Chest Port 1v Same Day  11/11/2014   CLINICAL DATA:  Shortness of breath, CHF, history diabetes mellitus, coronary artery disease post MI  EXAM: PORTABLE CHEST - 1 VIEW SAME DAY  COMPARISON:  Portable exam 1042 hours compared 11/07/2014  FINDINGS: Enlargement of cardiac silhouette.  Mediastinal contours and pulmonary vascularity normal.  Low lung volumes with bibasilar atelectasis greater on LEFT.  No infiltrate, pleural effusion or pneumothorax.  IMPRESSION: Enlargement of cardiac silhouette with bibasilar atelectasis.   Electronically Signed   By: Ulyses Southward M.D.   On: 11/11/2014 10:52    EKG: N/A   Care during the described time interval was provided by me .  I have reviewed this patient's available data, including medical history, events of note, physical examination, and all test results as part of my evaluation. I have personally reviewed and interpreted all radiology studies.  Time spent: 35  minutes  Drema Dallas [facility] Pager [phone]  If 7PM-7AM, please contact night-coverage [email or web address] Password [facility] 11/12/2014, 3:32 PM

## 2014-11-12 DEATH — deceased

## 2014-11-13 ENCOUNTER — Inpatient Hospital Stay (HOSPITAL_COMMUNITY): Payer: BLUE CROSS/BLUE SHIELD

## 2014-11-13 DIAGNOSIS — R7881 Bacteremia: Secondary | ICD-10-CM

## 2014-11-13 LAB — CBC WITH DIFFERENTIAL/PLATELET
BASOS ABS: 0 10*3/uL (ref 0.0–0.1)
BASOS PCT: 0 % (ref 0–1)
EOS ABS: 0 10*3/uL (ref 0.0–0.7)
Eosinophils Relative: 0 % (ref 0–5)
HCT: 36.1 % — ABNORMAL LOW (ref 39.0–52.0)
Hemoglobin: 12 g/dL — ABNORMAL LOW (ref 13.0–17.0)
LYMPHS PCT: 6 % — AB (ref 12–46)
Lymphs Abs: 0.8 10*3/uL (ref 0.7–4.0)
MCH: 30.6 pg (ref 26.0–34.0)
MCHC: 33.2 g/dL (ref 30.0–36.0)
MCV: 92.1 fL (ref 78.0–100.0)
MONO ABS: 1.4 10*3/uL — AB (ref 0.1–1.0)
Monocytes Relative: 10 % (ref 3–12)
NEUTROS PCT: 84 % — AB (ref 43–77)
Neutro Abs: 11.9 10*3/uL — ABNORMAL HIGH (ref 1.7–7.7)
PLATELETS: 203 10*3/uL (ref 150–400)
RBC: 3.92 MIL/uL — AB (ref 4.22–5.81)
RDW: 14 % (ref 11.5–15.5)
WBC: 14.1 10*3/uL — AB (ref 4.0–10.5)

## 2014-11-13 LAB — COMPREHENSIVE METABOLIC PANEL
ALT: 24 U/L (ref 17–63)
ANION GAP: 9 (ref 5–15)
AST: 36 U/L (ref 15–41)
Albumin: 1.6 g/dL — ABNORMAL LOW (ref 3.5–5.0)
Alkaline Phosphatase: 60 U/L (ref 38–126)
BILIRUBIN TOTAL: 1.7 mg/dL — AB (ref 0.3–1.2)
BUN: 25 mg/dL — ABNORMAL HIGH (ref 6–20)
CO2: 24 mmol/L (ref 22–32)
Calcium: 7.3 mg/dL — ABNORMAL LOW (ref 8.9–10.3)
Chloride: 95 mmol/L — ABNORMAL LOW (ref 101–111)
Creatinine, Ser: 1.08 mg/dL (ref 0.61–1.24)
GFR calc Af Amer: >60 mL/min (ref >60)
GFR calc non Af Amer: >60 mL/min (ref >60)
GLUCOSE: 196 mg/dL — AB (ref 65–99)
POTASSIUM: 4.9 mmol/L (ref 3.5–5.1)
Sodium: 128 mmol/L — ABNORMAL LOW (ref 135–145)
TOTAL PROTEIN: 5.2 g/dL — AB (ref 6.5–8.1)

## 2014-11-13 LAB — GLUCOSE, CAPILLARY
GLUCOSE-CAPILLARY: 238 mg/dL — AB (ref 65–99)
GLUCOSE-CAPILLARY: 254 mg/dL — AB (ref 65–99)
GLUCOSE-CAPILLARY: 224 mg/dL — AB (ref 65–99)
Glucose-Capillary: 135 mg/dL — ABNORMAL HIGH (ref 65–99)

## 2014-11-13 LAB — BRAIN NATRIURETIC PEPTIDE: B Natriuretic Peptide: 251 pg/mL — ABNORMAL HIGH (ref 0.0–100.0)

## 2014-11-13 MED ORDER — SPIRONOLACTONE 25 MG PO TABS
12.5000 mg | ORAL_TABLET | Freq: Two times a day (BID) | ORAL | Status: DC
  Administered 2014-11-13 – 2014-11-15 (×6): 12.5 mg via ORAL
  Filled 2014-11-13 (×2): qty 1
  Filled 2014-11-13: qty 0.5
  Filled 2014-11-13 (×5): qty 1

## 2014-11-13 MED ORDER — PIPERACILLIN-TAZOBACTAM 3.375 G IVPB
3.3750 g | Freq: Three times a day (TID) | INTRAVENOUS | Status: DC
  Administered 2014-11-13 – 2014-11-15 (×7): 3.375 g via INTRAVENOUS
  Filled 2014-11-13 (×9): qty 50

## 2014-11-13 MED ORDER — FUROSEMIDE 10 MG/ML IJ SOLN
40.0000 mg | Freq: Every day | INTRAMUSCULAR | Status: DC
  Administered 2014-11-13 – 2014-11-14 (×2): 40 mg via INTRAVENOUS
  Filled 2014-11-13 (×2): qty 4

## 2014-11-13 MED ORDER — IRBESARTAN 75 MG PO TABS
37.5000 mg | ORAL_TABLET | Freq: Two times a day (BID) | ORAL | Status: DC
  Administered 2014-11-13 – 2014-11-15 (×5): 37.5 mg via ORAL
  Filled 2014-11-13 (×6): qty 0.5

## 2014-11-13 MED ORDER — INSULIN GLARGINE 100 UNIT/ML ~~LOC~~ SOLN
40.0000 [IU] | Freq: Every day | SUBCUTANEOUS | Status: DC
  Administered 2014-11-13 – 2014-11-15 (×3): 40 [IU] via SUBCUTANEOUS
  Filled 2014-11-13 (×3): qty 0.4

## 2014-11-13 NOTE — Progress Notes (Addendum)
ANTIBIOTIC CONSULT NOTE - INITIAL  Pharmacy Consult for Zosyn Indication: GNR bacteremia  No Known Allergies  Patient Measurements: Height:  (175.3 cm) Weight: 291 lb 3.6 oz (132.1 kg) IBW/kg (Calculated) : 70.7  Vital Signs: Temp: 98.2 F (36.8 C) (09/02 0800) Temp Source: Oral (09/02 0800) BP: 123/49 mmHg (09/02 0800) Pulse Rate: 105 (09/02 0934) Intake/Output from previous day: 09/01 0701 - 09/02 0700 In: 920 [P.O.:920] Out: 2275 [Urine:2275] Intake/Output from this shift:    Labs:  Recent Labs  11/11/14 0257 11/12/14 0255 11/13/14 0237 11/13/14 0719  WBC 18.8* 14.7*  --  14.1*  HGB 13.0 13.1  --  12.0*  PLT 201 165  --  203  CREATININE 1.08 1.12 1.08  --    Estimated Creatinine Clearance: 104.2 mL/min (by C-G formula based on Cr of 1.08). No results for input(s): VANCOTROUGH, VANCOPEAK, VANCORANDOM, GENTTROUGH, GENTPEAK, GENTRANDOM, TOBRATROUGH, TOBRAPEAK, TOBRARND, AMIKACINPEAK, AMIKACINTROU, AMIKACIN in the last 72 hours.   Microbiology: Recent Results (from the past 720 hour(s))  MRSA PCR Screening     Status: None   Collection Time: 2014/11/13 11:17 PM  Result Value Ref Range Status   MRSA by PCR NEGATIVE NEGATIVE Final    Comment:        The GeneXpert MRSA Assay (FDA approved for NASAL specimens only), is one component of a comprehensive MRSA colonization surveillance program. It is not intended to diagnose MRSA infection nor to guide or monitor treatment for MRSA infections.   Culture, blood (routine x 2)     Status: None (Preliminary result)   Collection Time: 11/12/14  4:00 PM  Result Value Ref Range Status   Specimen Description BLOOD RIGHT HAND  Final   Special Requests IN PEDIATRIC BOTTLE 2CC  Final   Culture PENDING  Incomplete   Report Status PENDING  Incomplete  Culture, blood (routine x 2)     Status: None (Preliminary result)   Collection Time: 11/12/14  4:10 PM  Result Value Ref Range Status   Specimen Description BLOOD RIGHT  ARM  Final   Special Requests IN PEDIATRIC BOTTLE 2CC  Final   Culture  Setup Time   Final    GRAM NEGATIVE RODS AEROBIC BOTTLE ONLY CRITICAL RESULT CALLED TO, READ BACK BY AND VERIFIED WITH: Gypsy Decant AT 0720 ON [id number] BY Lucienne Capers    Culture GRAM NEGATIVE RODS  Final   Report Status PENDING  Incomplete    Medical History: Past Medical History  Diagnosis Date  . CHF 12/14/2009    EF 35% at the time of MI.  50% echo 6/12  . CAD S/P percutaneous coronary angioplasty     November 14, 2009 anterior MI LAD 80% ostial, 70% proximal followed by total occlusion, ramus intermediate total occlusion circumflex 80%, PDA 90% lesions. Overlapping drug-eluting stents to the LAD  . ST elevation myocardial infarction (STEMI) of anterior wall 11/13/2009  . Diabetes mellitus   . HTN (hypertension)   . Hyperlipidemia   . Diabetes mellitus   . Overweight     Medications:  Scheduled:  . antiseptic oral rinse  7 mL Mouth Rinse BID  . aspirin  81 mg Oral Daily  . atorvastatin  40 mg Oral q1800  . carvedilol  25 mg Oral BID WC  . heparin subcutaneous  5,000 Units Subcutaneous 3 times per day  . insulin aspart  0-20 Units Subcutaneous TID WC  . insulin aspart  10 Units Subcutaneous TID WC  . insulin glargine  30 Units Subcutaneous  QHS  . irbesartan  37.5 mg Oral Daily  . isosorbide mononitrate  30 mg Oral Daily  . piperacillin-tazobactam (ZOSYN)  IV  3.375 g Intravenous 3 times per day  . polyethylene glycol  17 g Oral BID  . senna-docusate  1 tablet Oral BID  . simethicone  40 mg Oral TID  . ticagrelor  90 mg Oral BID   Assessment: 54yo male admitted 11-26-14 for STEMI and acute pancreatitis from OSH, now resolved. Pharmacy consulted to start Zosyn.  PMH DM, HTN, HLD, obesity, CAD s/p anterior MI with DES x 2 to LAD (2011), EF 45-50%  Afebrile with leukocytosis peaking at 18.8, down to 14.1 today. Blood cx drawn yesterday positive for GNR. Unclear source of bacteremia.   9/2 Zosyn  >>  9/01 blood >> GNR 8/23 MRSA screen neg  8/31 CXR Enlargement of cardiac silhouette with bibasilar atelectasis   8/31 Abd XR Nonobstructive bowel gas pattern. Less gaseous distention of the stomach.   Goal of Therapy:  Resolution of infection  Plan:  Start Zosyn 3.375 g IV q8h extended infusion F/u GNR speciation and susceptibilities Monitor renal function, CBC, and clinical improvement F/u duration of abx therapy   Hillery Aldo, Pharm.D. PGY2 Pharmacy Resident Pager: [phone]  11/13/2014,9:38 AM

## 2014-11-13 NOTE — Progress Notes (Signed)
Spoke with  Morrie Sheldon in Ultrasound about abdominal ultrasound. Made aware that patient has already consumed half of meal. Nurse was advised to start NPO status now and ultrasound will be done today after 7pm. Patient updated and made aware.

## 2014-11-13 NOTE — Progress Notes (Signed)
CRITICAL VALUE ALERT  Critical value received:  Blood cultures: gram negative rod   Date of notification:  11/13/2014  Time of notification:  0720  Critical value read back:Yes.    MD notified (1st page):  MD Tresa Endo  Time of first page:  0735 Ivery Quale, RN

## 2014-11-13 NOTE — Progress Notes (Signed)
Received phone call from lab that blood sample was coagulated.  Lab to send phlebotomy to redraw lab sample. Ivery Quale, RN

## 2014-11-13 NOTE — Progress Notes (Signed)
Physical Therapy Treatment/ DIscharge Patient Details Name: Justin [name] MRN: [id number] DOB: October 17, 1960 Today's Date: 11/13/2014    History of Present Illness 25M with DM, HTN, HLD, obesity and CAD s/p anterior wall MI (2011) with residual 3VD that was not amendeble to revascularization c/b LVSD; Presents with lateral STEMI in the setting of acute pancreatitis; EF 20-25%    PT Comments    Pt with greatly improved activity tolerance today with increased gait and decreased supplemental oxygen demand. Pt continues to request use of RW and able to mobilize at supervision level acutely with family also able to provide. At this time recommend continued mobility with nursing and cardiac rehab with follow up in OPPT for balance at D/C. No further acute needs at this time with pt and family aware and agreeable.   Follow Up Recommendations  Outpatient PT     Equipment Recommendations  Rolling walker with 5" wheels    Recommendations for Other Services       Precautions / Restrictions Precautions Precautions: Fall    Mobility  Bed Mobility               General bed mobility comments: in chair on arrival  Transfers       Sit to Stand: Modified independent (Device/Increase time)         General transfer comment: pt able to complete transfers without assist  Ambulation/Gait Ambulation/Gait assistance: Supervision Ambulation Distance (Feet): 1000 Feet Assistive device: Rolling walker (2 wheeled) Gait Pattern/deviations: Step-through pattern;Decreased stride length   Gait velocity interpretation: at or above normal speed for age/gender General Gait Details: appropriate RW use with cues for breathing technique and maintaines sats 92-94% on 4L with gait   Stairs            Wheelchair Mobility    Modified Rankin (Stroke Patients Only)       Balance     Sitting balance-Leahy Scale: Good       Standing balance-Leahy Scale: Good Standing balance comment: pt  continues to deny attempting mobility or gait without use of RW                    Cognition Arousal/Alertness: Awake/alert Behavior During Therapy: Flat affect         Memory: Decreased short-term memory         General Comments: remains unable to recall room number    Exercises General Exercises - Lower Extremity Long Arc Quad: AROM;Seated;Both;15 reps Hip Flexion/Marching: AROM;Seated;Both;15 reps    General Comments        Pertinent Vitals/Pain Pain Assessment: No/denies pain  HR 105 sats 92-95% on 4L with cues    Home Living                      Prior Function            PT Goals (current goals can now be found in the care plan section) Progress towards PT goals: Goals met/education completed, patient discharged from PT    Frequency       PT Plan Current plan remains appropriate    Co-evaluation             End of Session Equipment Utilized During Treatment: Oxygen Activity Tolerance: Patient tolerated treatment well Patient left: in chair;with call bell/phone within reach;with family/visitor present     Time: 5364-6803 PT Time Calculation (min) (ACUTE ONLY): 23 min  Charges:  $Gait Training: 8-22 mins $Therapeutic Exercise: 8-22 mins  G CodesMelford Aase 12-04-14, 9:45 AM Elwyn Reach, [place]

## 2014-11-13 NOTE — Progress Notes (Signed)
Inpatient Diabetes Program Recommendations  AACE/ADA: New Consensus Statement on Inpatient Glycemic Control (2013)  Target Ranges:  Prepandial:   less than 140 mg/dL      Peak postprandial:   less than 180 mg/dL (1-2 hours)      Critically ill patients:  140 - 180 mg/dL   Results for [name], Justin (MRN [id number]) as of 11/13/2014 09:52  Ref. Range 11/12/2014 07:43 11/12/2014 12:04 11/12/2014 16:24 11/12/2014 21:21 11/13/2014 08:38  Glucose-Capillary Latest Ref Range: 65-99 mg/dL 956 (H) 213 (H) 086 (H) 166 (H) 238 (H)   Reason for Glycemic Control Review: Hyperglycemia  Diabetes history: DM 2 Outpatient Diabetes medications: Amaryl 4 mg Daily, Metformin 1,000 mg BID Current orders for Inpatient glycemic control: Lantus 30 units QHS, Novolog Resistant + Novolog 10 units meal coverage  Inpatient Diabetes Program Recommendations Insulin - Basal: Glucose still in the 230's after 30 units of basal insulin. Please consider increasing basal dose for tonight to 38 units.  Note: A1c level 6.4%, great control before admission. Unsure of glycemic control at home once discharged especially since the patient is requiring a lot of insulin at this time. Will need to check glucose frequently and follow up with PCP.   Thanks,  Christena Deem RN, MSN, [facility] Inpatient Diabetes Coordinator Team Pager [phone]

## 2014-11-13 NOTE — Progress Notes (Signed)
Subjective:  Breathing better today  Objective:   Vital Signs : Filed Vitals:   11/13/14 0429 11/13/14 0544 11/13/14 0800 11/13/14 0934  BP: 88/42 93/52 123/49   Pulse: 102 103 106 105  Temp: 97.3 F (36.3 C)  98.2 F (36.8 C)   TempSrc: Oral  Oral   Resp:      Height:      Weight:      SpO2: 89% 91% 91% 93%    Intake/Output from previous day:  Intake/Output Summary (Last 24 hours) at 11/13/14 0935 Last data filed at 11/13/14 0700  Gross per 24 hour  Intake    800 ml  Output   2275 ml  Net  -1475 ml    I/O since admission: -5262  Wt Readings from Last 3 Encounters:  11/13/14 291 lb 3.6 oz (132.1 kg)  11/18/13 265 lb (120.203 kg)  11/03/13 265 lb (120.203 kg)    Medications: . antiseptic oral rinse  7 mL Mouth Rinse BID  . aspirin  81 mg Oral Daily  . atorvastatin  40 mg Oral q1800  . carvedilol  25 mg Oral BID WC  . heparin subcutaneous  5,000 Units Subcutaneous 3 times per day  . insulin aspart  0-20 Units Subcutaneous TID WC  . insulin aspart  10 Units Subcutaneous TID WC  . insulin glargine  30 Units Subcutaneous QHS  . irbesartan  37.5 mg Oral Daily  . isosorbide mononitrate  30 mg Oral Daily  . piperacillin-tazobactam (ZOSYN)  IV  3.375 g Intravenous 3 times per day  . polyethylene glycol  17 g Oral BID  . senna-docusate  1 tablet Oral BID  . simethicone  40 mg Oral TID  . ticagrelor  90 mg Oral BID    . sodium chloride Stopped (11/06/14 0700)    Physical Exam:   General appearance: alert, cooperative and no distress Neck: no adenopathy, no carotid bruit, no JVD, supple, symmetrical, trachea midline and thyroid not enlarged, symmetric, no tenderness/mass/nodules Lungs: no wheezing Heart: RRR 1/6 sem Abdomen: distended less tense BS+ Extremities: edema improved Neurologic: Grossly normal   Rate: 96  Rhythm: normal sinus rhythm  ECG (independently read by me):  Lab Results:   Recent Labs  11/11/14 0257 11/12/14 0255 11/13/14 0237   NA 132* 128* 128*  K 5.0 4.6 4.9  CL 97* 95* 95*  CO2 27 26 24   GLUCOSE 266* 298* 196*  BUN 23* 26* 25*  CREATININE 1.08 1.12 1.08  CALCIUM 7.8* 7.7* 7.3*    Hepatic Function Latest Ref Rng 11/13/2014 11/12/2014 11/10/2014  Total Protein 6.5 - 8.1 g/dL 5.2(L) 6.8 5.8(L)  Albumin 3.5 - 5.0 g/dL 1.6(L) 1.7(L) 2.0(L)  AST 15 - 41 U/L 36 34 39  ALT 17 - 63 U/L 24 29 38  Alk Phosphatase 38 - 126 U/L 60 66 69  Total Bilirubin 0.3 - 1.2 mg/dL 1.7(H) 1.3(H) 1.6(H)  Bilirubin, Direct 0.1 - 0.5 mg/dL - - -     Recent Labs  11/11/14 0257 11/12/14 0255 11/13/14 0719  WBC 18.8* 14.7* 14.1*  NEUTROABS 16.2* 12.8* 11.9*  HGB 13.0 13.1 12.0*  HCT 38.6* 39.4 36.1*  MCV 93.0 92.7 92.1  PLT 201 165 203    No results for input(s): TROPONINI in the last 72 hours.  Invalid input(s): CK, MB  Lab Results  Component Value Date   TSH 1.122 11/13/2009   No results for input(s): HGBA1C in the last 72 hours.   Recent Labs  11/12/14 0255 11/13/14 0237  PROT 6.8 5.2*  ALBUMIN 1.7* 1.6*  AST 34 36  ALT 29 24  ALKPHOS 66 60  BILITOT 1.3* 1.7*   No results for input(s): INR in the last 72 hours. BNP (last 3 results)  Recent Labs  11/10/14 0236 11/13/14 0719  BNP 328.1* 251.0*    ProBNP (last 3 results) No results for input(s): PROBNP in the last 8760 hours.   Lipid Panel     Component Value Date/Time   CHOL 139 11/04/2014 0510   TRIG 99 11/04/2014 0510   HDL 81 11/04/2014 0510   CHOLHDL 1.7 11/04/2014 0510   VLDL 20 11/04/2014 0510   LDLCALC 38 11/04/2014 0510         Imaging:  Dg Abd 1 View  11/11/2014   CLINICAL DATA:  Abdominal distention and back pain with acute pancreatitis.  EXAM: ABDOMEN - 1 VIEW  COMPARISON:  11/04/2014 and CT 11/08/2014  FINDINGS: Exam demonstrates moderate gaseous distention of the stomach slightly improved. Bowel gas pattern is otherwise unremarkable without obstruction. There are mild degenerative changes of the spine. Remainder the exam  is unchanged.  IMPRESSION: Nonobstructive bowel gas pattern. Less gaseous distention of the stomach.   Electronically Signed   By: Marin Olp M.D.   On: 11/11/2014 17:20   Dg Chest Port 1v Same Day  11/11/2014   CLINICAL DATA:  Shortness of breath, CHF, history diabetes mellitus, coronary artery disease post MI  EXAM: PORTABLE CHEST - 1 VIEW SAME DAY  COMPARISON:  Portable exam 1042 hours compared 11/07/2014  FINDINGS: Enlargement of cardiac silhouette.  Mediastinal contours and pulmonary vascularity normal.  Low lung volumes with bibasilar atelectasis greater on LEFT.  No infiltrate, pleural effusion or pneumothorax.  IMPRESSION: Enlargement of cardiac silhouette with bibasilar atelectasis.   Electronically Signed   By: Lavonia Dana M.D.   On: 11/11/2014 10:52      Assessment/Plan:   Principal Problem:   ST elevation myocardial infarction (STEMI) of lateral wall, initial episode of care Active Problems:   Hyperlipidemia with target LDL less than 70   CAD S/P percutaneous coronary angioplasty: DES x 2 to mid-dLAD, x 1 to ostial-prox LAD   Chronic combined systolic and diastolic congestive heart failure   Acute pancreatitis   Coronary stent thrombosis   Essential hypertension   Type 2 diabetes mellitus without complication   Hypoxia   Abdominal distension   Diabetes type 2, controlled   Abdominal pain   Cholelithiasis with cholecystitis of other acuity   Acute renal failure syndrome   Thrombocytopenia   Systolic and diastolic CHF, chronic   HLD (hyperlipidemia)   Gallstone pancreatitis   Ileus  1. Day 10 s/p STEMI with stent thrombosis of prior LAD stents; rx with POBA but per Dr. Allison Quarry cath note, "He will likely require PCI with a new stent in the intervening segment between the ostial and mid LAD stents. However this can be performed once he has recovered from his pancreatitis." ECG with QS V1-6. No chest pain.  2. Post Infarct CHF, acute on chronic combined;  irbesartan  37.5 mg started per [facility] trial data (valsartan, not on formulary); BNP  Improved to 251; EF 20 - 25% on 8/26; May need life-vest prior to dc. 3. Pancreatitis: lipase has normalized 4. Renal insufficiency; resolved from peak Cr 3.6; 1.08. Tolerating ARB therapy 5. Leukocytosis: WBC increased to 18.8 from 9.7 on 8/29; BC + for gram negative rods;  To start zosyn extended infusion 5.  Hyperlipidemia; LFT's normal x bili 1.7. Atorvastain titrated to 40 mg; tolerating.  6. Type 2 DM 7. ? OSA;  Wakes up gasping for breath.  BP now 123/50 . Will increase irbesartan to 37.5 mg bid today and increase spironolactone to 12.5 mg bid. Continue lasix 40 mg iv today.   Troy Sine, MD, [facility] 11/13/2014, 9:35 AM

## 2014-11-13 NOTE — Consult Note (Signed)
[place] TEAM 1 - Stepdown/ICU TEAM CONSULT F/U NOTE  Justin [name] ZOX:[id number] DOB: October 18, 1960 DOA: 11-07-14 PCP: [name], MD  Admit HPI / Brief Narrative: 43M with DM, HTN, HLD, obesity and CAD s/p anterior wall MI (2011) with residual 3VD that was not amendeble to revascularization c/b LVSD (EF nadir 30-35%, improved to 45-50% in 2012) presented 11-07-14 with lateral STEMI in the setting of acute pancreatitis. Admitted under PCCM and cardiology team. TRh was consulted by cardiology team for evaluation of abd pain and pancreatitis.   HPI/Subjective: The patient has no new complaints today.  He denies chest pain shortness of breath nausea or vomiting.  He does admit to "mild pain" when his right upper abdominal quadrant is deeply palpated.  Recommendations/Plan:  CAD - ST elevation myocardial infarction (STEMI) of lateral wall - as per primary service (Cardiology)  Acute suspected gallstone pancreatitis - transaminitis  - lipase on admission > 3000 but has since normalized - LFT were elevated with [place] notable for cholelithiasis and gallbladder wall thickening - pt likely passed stone - will need referral for outpatient follow up - to schedule with Dr. [name] once acute medical issues resolved - pt will eventually need lap chole  - LFTs have now normalized   E coli bacteremia  - The patient is afebrile and has no localizing clinical symptoms - UA unremarkable, suggesting GI issues above as source of bacteremia  - Repeat right upper quadrant abdominal ultrasound to reevaluate the gallbladder  Ileus - pulled out NGT 8/26 - tolerating current diet w/o difficulty  - continue bowel regimen  Acute on chronic combined systolic and diastolic CHF - management per Cardiology - EF 20-25% by echo 11/06/14  Acute hypoxic respiratory failure  - Respirations appear comfortable and nonlabored today  Acute renal failure - Cr peaked at 3.69 but has now normalized -  holding stable  Hypokalemia - Resolved with supplementation  Hypocalcemia - Calcium currently corrects to normal range - further supplementation not presently indicated  Hyponatremia - follow with ongoing diuresis   HLD - was not on statin at home - Cardiology has resumed statin - following LFTs  Diabetes Mellitus 2 - 8/24 A1c 6.4 on oral medications alone - CBGs remain poorly controlled likely due to infection - adjust treatment further and follow  Thrombocytopenia - HIT panel unrevealing - PLT count has essentially normalized   Morbid obesity - Body mass index is 42.17 kg/(m^2).  Code Status: FULL Family Communication: Spoke with patient and family member at bedside  Antibiotics: Zosyn 9/2 >  DVT prophylaxis: SQ heparin   Objective: Blood pressure 123/49, pulse 103, temperature 98.2 F (36.8 C), temperature source Oral, resp. rate 20, height  (1.753 m), weight 132.1 kg (291 lb 3.6 oz), SpO2 93 %.  Intake/Output Summary (Last 24 hours) at 11/13/14 1138 Last data filed at 11/13/14 0700  Gross per 24 hour  Intake    800 ml  Output   2275 ml  Net  -1475 ml   Exam: General: No acute respiratory distress - alert and conversant Lungs: Good air movement throughout all fields - no wheeze Cardiovascular: Regular rate and rhythm without murmur  Abdomen: Mildly tender to deep palpation in right upper quadrant with no Murphy sign, protuberant/obese, soft, bowel sounds positive, no rebound, no ascites, no appreciable mass Extremities: No significant cyanosis, clubbing;  1+ edema bilateral lower extremities  Data Reviewed: Basic Metabolic Panel:  Recent Labs Lab 11/07/14 0559 11/08/14 0644 11/09/14 0234 11/10/14 0236 11/11/14  0257 11/12/14 0255 11/13/14 0237  NA 132* 138 135 131* 132* 128* 128*  K 3.6 3.3* 4.9 4.6 5.0 4.6 4.9  CL 103 111 103 98* 97* 95* 95*  CO2 20* 19* 27 26 27 26 24   GLUCOSE 162* 150* 189* 245* 266* 298* 196*  BUN 63* 29* 24* 25* 23*  26* 25*  CREATININE 1.38* 0.74 1.01 1.02 1.08 1.12 1.08  CALCIUM 6.6* 5.7* 7.8* 7.8* 7.8* 7.7* 7.3*  MG 1.9 2.0 2.6*  --   --   --   --     CBC:  Recent Labs Lab 11/09/14 0234 11/10/14 0236 11/11/14 0257 11/12/14 0255 11/13/14 0719  WBC 9.7 15.3* 18.8* 14.7* 14.1*  NEUTROABS 9.2* 12.2* 16.2* 12.8* 11.9*  HGB 13.7 14.3 13.0 13.1 12.0*  HCT 41.0 41.8 38.6* 39.4 36.1*  MCV 92.6 92.3 93.0 92.7 92.1  PLT 151 150 201 165 203    Liver Function Tests:  Recent Labs Lab 11/08/14 0644 11/09/14 0234 11/10/14 0236 11/12/14 0255 11/13/14 0237  AST 32 39 39 34 36  ALT 50 50 38 29 24  ALKPHOS 41 59 69 66 60  BILITOT 1.5* 1.6* 1.6* 1.3* 1.7*  PROT 4.6* 6.0* 5.8* 6.8 5.2*  ALBUMIN 1.8* 2.2* 2.0* 1.7* 1.6*    Recent Labs Lab 11/07/14 0559 11/08/14 0644 11/09/14 0234 11/11/14 1119  LIPASE 52* 22 27 35  AMYLASE  --   --   --  59   Cardiac Enzymes:  Recent Labs Lab 11/06/14 1923  11/07/14 0559 11/07/14 1128 11/07/14 1655 11/07/14 2329 11/08/14 0644  CKTOTAL 497*  --   --   --   --   --   --   TROPONINI 13.76*  < > 10.15* 8.41* 8.30* 4.68* 4.41*  < > = values in this interval not displayed.  CBG:  Recent Labs Lab 11/12/14 0743 11/12/14 1204 11/12/14 1624 11/12/14 2121 11/13/14 0838  GLUCAP 293* 326* 202* 166* 238*    Recent Results (from the past 240 hour(s))  MRSA PCR Screening     Status: None   Collection Time: 11/08/2014 11:17 PM  Result Value Ref Range Status   MRSA by PCR NEGATIVE NEGATIVE Final    Comment:        The GeneXpert MRSA Assay (FDA approved for NASAL specimens only), is one component of a comprehensive MRSA colonization surveillance program. It is not intended to diagnose MRSA infection nor to guide or monitor treatment for MRSA infections.   Culture, blood (routine x 2)     Status: None (Preliminary result)   Collection Time: 11/12/14  4:00 PM  Result Value Ref Range Status   Specimen Description BLOOD RIGHT HAND  Final    Special Requests IN PEDIATRIC BOTTLE 2CC  Final   Culture PENDING  Incomplete   Report Status PENDING  Incomplete  Culture, blood (routine x 2)     Status: None (Preliminary result)   Collection Time: 11/12/14  4:10 PM  Result Value Ref Range Status   Specimen Description BLOOD RIGHT ARM  Final   Special Requests IN PEDIATRIC BOTTLE 2CC  Final   Culture  Setup Time   Final    GRAM NEGATIVE RODS AEROBIC BOTTLE ONLY CRITICAL RESULT CALLED TO, READ BACK BY AND VERIFIED WITH: Gypsy Decant AT 0720 ON [id number] BY Lucienne Capers    Culture GRAM NEGATIVE RODS  Final   Report Status PENDING  Incomplete     Studies:   Recent x-ray studies have been reviewed in  detail by the Attending Physician  Scheduled Meds:  Scheduled Meds: . antiseptic oral rinse  7 mL Mouth Rinse BID  . aspirin  81 mg Oral Daily  . atorvastatin  40 mg Oral q1800  . carvedilol  25 mg Oral BID WC  . furosemide  40 mg Intravenous Daily  . heparin subcutaneous  5,000 Units Subcutaneous 3 times per day  . insulin aspart  0-20 Units Subcutaneous TID WC  . insulin aspart  10 Units Subcutaneous TID WC  . insulin glargine  30 Units Subcutaneous QHS  . irbesartan  37.5 mg Oral BID  . isosorbide mononitrate  30 mg Oral Daily  . piperacillin-tazobactam (ZOSYN)  IV  3.375 g Intravenous 3 times per day  . polyethylene glycol  17 g Oral BID  . senna-docusate  1 tablet Oral BID  . simethicone  40 mg Oral TID  . spironolactone  12.5 mg Oral BID  . ticagrelor  90 mg Oral BID    Time spent on care of this patient: 35 mins   Kavitha Lansdale T , MD   [facility] Office  [phone] Pager - Text Page per Loretha Stapler as per below:  On-Call/Text Page:      [email or web address]      password [email or web address]  If 7PM-7AM, please contact night-coverage [email or web address] Password [email or web address] 11/13/2014, 11:38 AM   LOS: 10 days

## 2014-11-13 NOTE — Progress Notes (Signed)
CARDIAC REHAB PHASE I   PRE:  Rate/Rhythm: 101 ST    BP: sitting 94/46    SaO2: 92 4L  MODE:  Ambulation: 700 ft   POST:  Rate/Rhythm: 110 ST    BP: sitting 89/54     SaO2: 91 RA  Pt moving well, denied problems. Weaned to RA on second lap of walk, SaO2 maintained 91 RA. However after sitting for 5 min SAO2 down to 83 RA. Reapplied 2L. Will notify RN. Currently 93 2L. BP low today. Denied dizziness. [id number]   Justin [name] [place] CES, ACSM 11/13/2014 12:37 PM

## 2014-11-14 ENCOUNTER — Inpatient Hospital Stay (HOSPITAL_COMMUNITY): Payer: BLUE CROSS/BLUE SHIELD

## 2014-11-14 LAB — COMPREHENSIVE METABOLIC PANEL
ALBUMIN: 1.6 g/dL — AB (ref 3.5–5.0)
ALK PHOS: 58 U/L (ref 38–126)
ALT: 26 U/L (ref 17–63)
AST: 41 U/L (ref 15–41)
Anion gap: 7 (ref 5–15)
BILIRUBIN TOTAL: 1.4 mg/dL — AB (ref 0.3–1.2)
BUN: 28 mg/dL — AB (ref 6–20)
CALCIUM: 7.6 mg/dL — AB (ref 8.9–10.3)
CO2: 29 mmol/L (ref 22–32)
CREATININE: 1.2 mg/dL (ref 0.61–1.24)
Chloride: 93 mmol/L — ABNORMAL LOW (ref 101–111)
GFR calc Af Amer: >60 mL/min (ref >60)
GFR calc non Af Amer: >60 mL/min (ref >60)
GLUCOSE: 195 mg/dL — AB (ref 65–99)
POTASSIUM: 4.3 mmol/L (ref 3.5–5.1)
Sodium: 129 mmol/L — ABNORMAL LOW (ref 135–145)
TOTAL PROTEIN: 5.5 g/dL — AB (ref 6.5–8.1)

## 2014-11-14 LAB — CBC WITH DIFFERENTIAL/PLATELET
BASOS PCT: 0 % (ref 0–1)
Basophils Absolute: 0 10*3/uL (ref 0.0–0.1)
EOS PCT: 0 % (ref 0–5)
Eosinophils Absolute: 0 10*3/uL (ref 0.0–0.7)
HEMATOCRIT: 34.8 % — AB (ref 39.0–52.0)
HEMOGLOBIN: 11.5 g/dL — AB (ref 13.0–17.0)
Lymphocytes Relative: 11 % — ABNORMAL LOW (ref 12–46)
Lymphs Abs: 1.2 10*3/uL (ref 0.7–4.0)
MCH: 30.5 pg (ref 26.0–34.0)
MCHC: 33 g/dL (ref 30.0–36.0)
MCV: 92.3 fL (ref 78.0–100.0)
MONOS PCT: 6 % (ref 3–12)
Monocytes Absolute: 0.7 10*3/uL (ref 0.1–1.0)
NEUTROS PCT: 83 % — AB (ref 43–77)
Neutro Abs: 9.4 10*3/uL — ABNORMAL HIGH (ref 1.7–7.7)
Platelets: 209 10*3/uL (ref 150–400)
RBC: 3.77 MIL/uL — AB (ref 4.22–5.81)
RDW: 13.8 % (ref 11.5–15.5)
WBC MORPHOLOGY: INCREASED
WBC: 11.3 10*3/uL — AB (ref 4.0–10.5)

## 2014-11-14 LAB — GLUCOSE, CAPILLARY
GLUCOSE-CAPILLARY: 207 mg/dL — AB (ref 65–99)
GLUCOSE-CAPILLARY: 190 mg/dL — AB (ref 65–99)
Glucose-Capillary: 228 mg/dL — ABNORMAL HIGH (ref 65–99)
Glucose-Capillary: 238 mg/dL — ABNORMAL HIGH (ref 65–99)

## 2014-11-14 LAB — LIPASE, BLOOD: Lipase: 49 U/L (ref 22–51)

## 2014-11-14 MED ORDER — CARVEDILOL 12.5 MG PO TABS
12.5000 mg | ORAL_TABLET | Freq: Two times a day (BID) | ORAL | Status: DC
  Administered 2014-11-14 – 2014-11-15 (×2): 12.5 mg via ORAL
  Filled 2014-11-14 (×2): qty 1

## 2014-11-14 MED ORDER — FUROSEMIDE 10 MG/ML IJ SOLN
40.0000 mg | Freq: Two times a day (BID) | INTRAMUSCULAR | Status: DC
  Administered 2014-11-14 – 2014-11-15 (×3): 40 mg via INTRAVENOUS
  Filled 2014-11-14 (×3): qty 4

## 2014-11-14 MED ORDER — ISOSORBIDE MONONITRATE ER 30 MG PO TB24
15.0000 mg | ORAL_TABLET | Freq: Every day | ORAL | Status: DC
  Administered 2014-11-15: 15 mg via ORAL
  Filled 2014-11-14: qty 1

## 2014-11-14 NOTE — Progress Notes (Signed)
Subjective:  Weak; denies dyspnea or chest pain.  Objective:   Vital Signs : Filed Vitals:   11/14/14 0452 11/14/14 0500 11/14/14 0600 11/14/14 0811  BP:   92/50 94/51  Pulse:   98 100  Temp: 97.1 F (36.2 C)   99.1 F (37.3 C)  TempSrc: Oral   Oral  Resp:    18  Height:      Weight:  289 lb 4.8 oz (131.226 kg)    SpO2:   93% 92%    Intake/Output from previous day:  Intake/Output Summary (Last 24 hours) at 11/14/14 0952 Last data filed at 11/14/14 0636  Gross per 24 hour  Intake    440 ml  Output   1126 ml  Net   -686 ml    I/O since admission: -5262  Wt Readings from Last 3 Encounters:  11/14/14 289 lb 4.8 oz (131.226 kg)  11/18/13 265 lb (120.203 kg)  11/03/13 265 lb (120.203 kg)    Medications: . antiseptic oral rinse  7 mL Mouth Rinse BID  . aspirin  81 mg Oral Daily  . atorvastatin  40 mg Oral q1800  . carvedilol  25 mg Oral BID WC  . furosemide  40 mg Intravenous Daily  . heparin subcutaneous  5,000 Units Subcutaneous 3 times per day  . insulin aspart  0-20 Units Subcutaneous TID WC  . insulin aspart  10 Units Subcutaneous TID WC  . insulin glargine  40 Units Subcutaneous QHS  . irbesartan  37.5 mg Oral BID  . isosorbide mononitrate  30 mg Oral Daily  . piperacillin-tazobactam (ZOSYN)  IV  3.375 g Intravenous 3 times per day  . polyethylene glycol  17 g Oral BID  . senna-docusate  1 tablet Oral BID  . simethicone  40 mg Oral TID  . spironolactone  12.5 mg Oral BID  . ticagrelor  90 mg Oral BID    . sodium chloride Stopped (11/06/14 0700)    Physical Exam:   General appearance: alert, cooperative and no distress HEENT: normal Neck:  supple Lungs: CTA anteriorly Heart: RRR  Abdomen: mildly distended, not tender, no masses Extremities: 1+ edema Neurologic: Grossly normal   Lab Results:   Recent Labs  11/12/14 0255 11/13/14 0237 11/14/14 0230  NA 128* 128* 129*  K 4.6 4.9 4.3  CL 95* 95* 93*  CO2 26 24 29   GLUCOSE 298* 196*  195*  BUN 26* 25* 28*  CREATININE 1.12 1.08 1.20  CALCIUM 7.7* 7.3* 7.6*    Hepatic Function Latest Ref Rng 11/14/2014 11/13/2014 11/12/2014  Total Protein 6.5 - 8.1 g/dL 5.5(L) 5.2(L) 6.8  Albumin 3.5 - 5.0 g/dL 1.6(L) 1.6(L) 1.7(L)  AST 15 - 41 U/L 41 36 34  ALT 17 - 63 U/L 26 24 29   Alk Phosphatase 38 - 126 U/L 58 60 66  Total Bilirubin 0.3 - 1.2 mg/dL 1.4(H) 1.7(H) 1.3(H)  Bilirubin, Direct 0.1 - 0.5 mg/dL - - -     Recent Labs  11/12/14 0255 11/13/14 0719 11/14/14 0230  WBC 14.7* 14.1* 11.3*  NEUTROABS 12.8* 11.9* 9.4*  HGB 13.1 12.0* 11.5*  HCT 39.4 36.1* 34.8*  MCV 92.7 92.1 92.3  PLT 165 203 209      Lab Results  Component Value Date   TSH 1.122 11/13/2009    Recent Labs  11/12/14 0255 11/13/14 0237 11/14/14 0230  PROT 6.8 5.2* 5.5*  ALBUMIN 1.7* 1.6* 1.6*  AST 34 36 41  ALT 29 24 26  ALKPHOS 66 60 58  BILITOT 1.3* 1.7* 1.4*   BNP (last 3 results)  Recent Labs  11/10/14 0236 11/13/14 0719  BNP 328.1* 251.0*    Lipid Panel     Component Value Date/Time   CHOL 139 11/04/2014 0510   TRIG 99 11/04/2014 0510   HDL 81 11/04/2014 0510   CHOLHDL 1.7 11/04/2014 0510   VLDL 20 11/04/2014 0510   LDLCALC 38 11/04/2014 0510         Imaging:  US Abdomen Limited Ruq  11/14/2014   CLINICAL DATA:  Acute cholecystitis  EXAM: US ABDOMEN LIMITED - RIGHT UPPER QUADRANT  COMPARISON:  None.  Abdominal ultrasound - 11/11/2014; CT abdomen and pelvis - 10/13/2014  FINDINGS: Gallbladder:  There are multiple echogenic stones again seen within the gallbladder (representative image 25). This finding is associated with a minimal amount of gallbladder wall thickening and pericholecystic fluid (representative image 24). Negative sonographic Murphy's sign.  Common bile duct:  Diameter: Borderline enlarged in size for age, measuring 7 mm in diameter  Liver:  There is diffuse increased slightly coarsened echogenicity of the hepatic parenchyma suggestive of hepatic steatosis.  Re- demonstrated an ill-defined area of focal decreased echogenicity adjacent to the gallbladder fossa suggestive of focal fatty sparing. Otherwise, no discrete hepatic lesions. No definite intrahepatic biliary duct dilatation. No ascites.  IMPRESSION: 1. Similar findings of cholelithiasis and suspected early acute cholecystitis. Further evaluation with nuclear medicine HIDA scan could be performed as clinically indicated. 2. Grossly unchanged suspected hepatic steatosis with focal fatty sparing adjacent to the gallbladder fossa.   Electronically Signed   By: Sandi Mariscal M.D.   On: 11/14/2014 08:23      Assessment/Plan:  1. S/p STEMI with stent thrombosis of prior LAD stents; rx with POBA but per Dr. Allison Quarry cath note, "He will likely require PCI with a new stent in the intervening segment between the ostial and mid LAD stents. However this can be performed once he has recovered from his pancreatitis."plan to continue aspirin, brilinta, and statin. Ultimately will repeat catheterization once medical issues are more stable. 2. Post Infarct CHF, acute on chronic combined- continue present dose of Lasix and follow renal function. Continue ARB and beta blocker. 3. Pancreatitis: lipase has normalized 4. Renal insufficiency- improved. Follow renal function with continued Lasix. 5. Bacteremia-continue a antibiotics primary care. 5. Hyperlipidemia-continue statin. 6. Type 2 DM  Kirk Ruths, MD, [facility] 11/14/2014, 9:52 AM

## 2014-11-14 NOTE — Progress Notes (Cosign Needed)
CARDIAC REHAB PHASE I   PRE:  Rate/Rhythm: 92 sinus rhythm  BP:  Supine:   Sitting: 86/48 recheck:  91/47  Standing:    SaO2: 97% 2L    MODE:  Ambulation: 300 ft   POST:  Rate/Rhythem: 101 sinus tachycardia  BP:  Supine:   Sitting: 84/54  Standing:     SaO2: 96% ra    1525-1545    Pt ambulated in hallway x2 assist, pt felt weak and tired prior to ambulation however no complaints during.  Pt returned to bed with call light in reach.   Regis Wiland, [place]

## 2014-11-14 NOTE — Consult Note (Signed)
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 Admitted under Cardiology team. [facility] was initially consulted by Cardiology team for evaluation of abd pain and pancreatitis.   HPI/Subjective: The patient states he "just feels crummy" today.  He denies specific complaints.  He specifically states he is not nauseated nor does he have abdominal pain.  He denies chest pain or shortness of breath.  Recommendations/Plan:  CAD - STEMI of lateral wall / stent thrombosis of prior LAD stents - as per Cardiology - plan for eventual PCI with new stents to be placed in the LAD  Acute gallstone pancreatitis - transaminitis  - lipase on admission > 3000 - has since normalized - LFTs were elevated with [place] notable for cholelithiasis and gallbladder wall thickening - pt likely passed stone - will need referral for outpatient follow up - to schedule with Dr. [name] once acute medical issues resolved - pt will eventually need lap chole  - LFTs have now normalized  - No clinical evidence of acute cholecystitis at the present time - follow-up right upper quadrant ultrasound without new findings  E coli bacteremia  - The patient is afebrile and has no localizing clinical symptoms - UA unremarkable, suggesting GI issues above as source of bacteremia  - Repeat right upper quadrant abdominal ultrasound to reevaluate the gallbladder without new findings - At this time I'll plan to complete a 14 day course of therapy - I will discuss implications regarding timing of cardiac cath with ID  Ileus - pulled out NGT  8/26 - tolerating current diet w/o difficulty  - continue bowel regimen  Acute on chronic combined systolic and diastolic CHF - ischemic - management per Cardiology - EF 20-25% by echo 11/06/14 -Clinically the patient appears volume overloaded today - weight has increased from 126 kg to 131 over the course of his hospital stay - increase diuresis and follow, but may become limited by relative hypotension - Cardiology considering life vest prior to discharge  Acute hypoxic respiratory failure  - Respirations appear comfortable and nonlabored today  Acute renal failure - Cr peaked at 3.69 but has now normalized - holding stable - follow with ongoing diuresis  Hypokalemia - Resolved with supplementation  Hypocalcemia - Calcium currently corrects to normal range - further supplementation not presently indicated  Hyponatremia - follow with ongoing diuresis   HLD - Cardiology has resumed statin - following LFTs  Diabetes Mellitus 2 - 8/24 A1c 6.4 on oral medications alone - CBGs improved but remain poorly controlled likely due to infection - follow trend without change today  Thrombocytopenia - HIT panel unrevealing - likely due to gram negative rod bacteremia  - PLT count has essentially normalized   Morbid obesity - Body mass index is 42.17 kg/(m^2).  Code Status: FULL Family Communication: Spoke with patient and multiple family members at bedside  Antibiotics: Zosyn 9/2 >  DVT prophylaxis: SQ heparin   Objective: Blood pressure 94/51, pulse 100, temperature 99.1 F (37.3 C), temperature source Oral, resp. rate 18, height 5\' 9"  (1.753 m), weight 131.226 kg (289 lb 4.8 oz), SpO2 92 %.  Intake/Output  Summary (Last 24 hours) at 11/14/14 1009 Last data filed at 11/14/14 0636  Gross per 24 hour  Intake    440 ml  Output   1126 ml  Net   -686 ml   Exam: General: No acute respiratory distress - alert  Lungs: Good air movement throughout all fields - no  wheeze Cardiovascular: Regular rate and rhythm without murmur or gallop Abdomen: Nontender, protuberant/obese, soft, bowel sounds positive, no rebound, no ascites, no appreciable mass Extremities: No significant cyanosis, clubbing;  2+ edema bilateral lower extremities  Data Reviewed: Basic Metabolic Panel:  Recent Labs Lab 11/08/14 0644 11/09/14 0234 11/10/14 0236 11/11/14 0257 11/12/14 0255 11/13/14 0237 11/14/14 0230  NA 138 135 131* 132* 128* 128* 129*  K 3.3* 4.9 4.6 5.0 4.6 4.9 4.3  CL 111 103 98* 97* 95* 95* 93*  CO2 19* GLUCOSE 150* 189* 245* 266* 298* 196* 195*  BUN 29* 24* 25* 23* 26* 25* 28*  CREATININE 0.74 1.01 1.02 1.08 1.12 1.08 1.20  CALCIUM 5.7* 7.8* 7.8* 7.8* 7.7* 7.3* 7.6*  MG 2.0 2.6*  --   --   --   --   --     CBC:  Recent Labs Lab 11/10/14 0236 11/11/14 0257 11/12/14 0255 11/13/14 0719 11/14/14 0230  WBC 15.3* 18.8* 14.7* 14.1* 11.3*  NEUTROABS 12.2* 16.2* 12.8* 11.9* 9.4*  HGB 14.3 13.0 13.1 12.0* 11.5*  HCT 41.8 38.6* 39.4 36.1* 34.8*  MCV 92.3 93.0 92.7 92.1 92.3  PLT 150 201 165 203 209    Liver Function Tests:  Recent Labs Lab 11/09/14 0234 11/10/14 0236 11/12/14 0255 11/13/14 0237 11/14/14 0230  AST 39 39 34 36 41  ALT 50 38 ALKPHOS 59 69 66 60 58  BILITOT 1.6* 1.6* 1.3* 1.7* 1.4*  PROT 6.0* 5.8* 6.8 5.2* 5.5*  ALBUMIN 2.2* 2.0* 1.7* 1.6* 1.6*    Recent Labs Lab 11/08/14 0644 11/09/14 0234 11/11/14 1119 11/14/14 0230  LIPASE 22 27 35 49  AMYLASE  --   --  59  --    Cardiac Enzymes:  Recent Labs Lab 11/07/14 1128 11/07/14 1655 11/07/14 2329 11/08/14 0644  TROPONINI 8.41* 8.30* 4.68* 4.41*    CBG:  Recent Labs Lab 11/13/14 0838 11/13/14 1159 11/13/14 1618 11/13/14 2146 11/14/14 0814  GLUCAP 238* 254* 224* 135* 228*    Recent Results (from the past 240 hour(s))  Culture, blood (routine x 2)     Status: None (Preliminary result)   Collection Time: 11/12/14  4:00 PM   Result Value Ref Range Status   Specimen Description BLOOD RIGHT HAND  Final   Special Requests IN PEDIATRIC BOTTLE 2CC  Final   Culture NO GROWTH 1 DAY  Final   Report Status PENDING  Incomplete  Culture, blood (routine x 2)     Status: None (Preliminary result)   Collection Time: 11/12/14  4:10 PM  Result Value Ref Range Status   Specimen Description BLOOD RIGHT ARM  Final   Special Requests IN PEDIATRIC BOTTLE 2CC  Final   Culture  Setup Time   Final    GRAM NEGATIVE RODS AEROBIC BOTTLE ONLY CRITICAL RESULT CALLED TO, READ BACK BY AND VERIFIED WITH: Gypsy Decant AT 0720 ON [id number] BY Lucienne Capers    Culture GRAM NEGATIVE RODS  Final   Report Status PENDING  Incomplete     Studies:   Recent x-ray studies have been reviewed in detail by the  Attending Physician  Scheduled Meds:  Scheduled Meds: . antiseptic oral rinse  7 mL Mouth Rinse BID  . aspirin  81 mg Oral Daily  . atorvastatin  40 mg Oral q1800  . carvedilol  25 mg Oral BID WC  . furosemide  40 mg Intravenous Daily  . heparin subcutaneous  5,000 Units Subcutaneous 3 times per day  . insulin aspart  0-20 Units Subcutaneous TID WC  . insulin aspart  10 Units Subcutaneous TID WC  . insulin glargine  40 Units Subcutaneous QHS  . irbesartan  37.5 mg Oral BID  . isosorbide mononitrate  30 mg Oral Daily  . piperacillin-tazobactam (ZOSYN)  IV  3.375 g Intravenous 3 times per day  . polyethylene glycol  17 g Oral BID  . senna-docusate  1 tablet Oral BID  . simethicone  40 mg Oral TID  . spironolactone  12.5 mg Oral BID  . ticagrelor  90 mg Oral BID    Time spent on care of this patient: 35 mins   MCCLUNG,JEFFREY T , MD   [facility] Office  [phone] Pager - Text Page per Loretha Stapler as per below:  On-Call/Text Page:      [email or web address]      password [email or web address]  If 7PM-7AM, please contact night-coverage [email or web address] Password [email or web address] 11/14/2014, 10:09 AM   LOS: 11 days

## 2014-11-15 LAB — COMPREHENSIVE METABOLIC PANEL
ALT: 26 U/L (ref 17–63)
ANION GAP: 7 (ref 5–15)
AST: 41 U/L (ref 15–41)
Albumin: 1.5 g/dL — ABNORMAL LOW (ref 3.5–5.0)
Alkaline Phosphatase: 62 U/L (ref 38–126)
BUN: 30 mg/dL — ABNORMAL HIGH (ref 6–20)
CHLORIDE: 93 mmol/L — AB (ref 101–111)
CO2: 28 mmol/L (ref 22–32)
Calcium: 7.4 mg/dL — ABNORMAL LOW (ref 8.9–10.3)
Creatinine, Ser: 1.34 mg/dL — ABNORMAL HIGH (ref 0.61–1.24)
GFR, EST NON AFRICAN AMERICAN: 58 mL/min — AB (ref >60)
Glucose, Bld: 235 mg/dL — ABNORMAL HIGH (ref 65–99)
POTASSIUM: 4.3 mmol/L (ref 3.5–5.1)
SODIUM: 128 mmol/L — AB (ref 135–145)
Total Bilirubin: 0.9 mg/dL (ref 0.3–1.2)
Total Protein: 5.6 g/dL — ABNORMAL LOW (ref 6.5–8.1)

## 2014-11-15 LAB — CULTURE, BLOOD (ROUTINE X 2)

## 2014-11-15 LAB — GLUCOSE, CAPILLARY
GLUCOSE-CAPILLARY: 228 mg/dL — AB (ref 65–99)
GLUCOSE-CAPILLARY: 207 mg/dL — AB (ref 65–99)
GLUCOSE-CAPILLARY: 219 mg/dL — AB (ref 65–99)

## 2014-11-15 MED ORDER — CARVEDILOL 6.25 MG PO TABS
6.2500 mg | ORAL_TABLET | Freq: Two times a day (BID) | ORAL | Status: DC
  Administered 2014-11-15: 6.25 mg via ORAL
  Filled 2014-11-15: qty 1

## 2014-11-15 MED ORDER — DEXTROSE 5 % IV SOLN
2.0000 g | INTRAVENOUS | Status: DC
  Administered 2014-11-15: 2 g via INTRAVENOUS
  Filled 2014-11-15 (×2): qty 2

## 2014-11-15 NOTE — Consult Note (Addendum)
[place] TEAM 1 - Stepdown/ICU TEAM CONSULT F/U NOTE  Morgan Rennert RUE:[id number] DOB: [birth year] DOA: 12/01/2014 PCP: [name], MD  Admit HPI / Brief Narrative: 37M with DM, HTN, HLD, obesity and CAD s/p anterior wall MI (2011) with residual 3VD that was not amendeble to revascularization c/b systolic CHF (EF nadir 30-35%, improved to 45-50% in 2012) who presented Dec 01, 2014 with a lateral STEMI in the setting of acute pancreatitis. Admitted under Cardiology team. [facility] was initially consulted by Cardiology team for evaluation of abd pain and pancreatitis.   HPI/Subjective: The patient is feeling better today.  He feels that he has more energy.  He denies chest pain shortness of breath or abdominal pain.  He denies nausea or vomiting and reports a good appetite.  Recommendations/Plan:  CAD - STEMI of lateral wall / stent thrombosis of prior LAD stents - as per Cardiology - plan for eventual PCI with new stents to be placed in the LAD - as per my discussion with ID there is no need to delay interventional cardiac cath/stent placement based solely upon Escherichia coli bacteremia  Acute gallstone pancreatitis - transaminitis  - lipase on admission > 3000 - has since normalized - LFTs were elevated with [place] notable for cholelithiasis and gallbladder wall thickening - pt likely passed stone - will need referral for outpatient follow up - to schedule with Dr. [name] once acute medical issues resolved - pt will eventually need lap chole  - LFTs have now normalized  - No clinical evidence of acute cholecystitis at the present time - follow-up right upper quadrant ultrasound without new findings - Patient appears to be medically stable presently in regard to this acute issue  E coli bacteremia  - The patient is afebrile and has no localizing clinical symptoms - UA unremarkable, suggesting GI issues above as source of bacteremia  - Repeat right upper quadrant abdominal  ultrasound to reevaluate the gallbladder without new findings - plan to complete a 14 day course of therapy - I discussed the clinical situation with ID who states there is no need to delay cardiac cath based upon this finding  Ileus - pulled out NGT 8/26 - tolerating current diet w/o difficulty  - continue bowel regimen - having regular bowel movements  Acute on chronic combined systolic and diastolic CHF - ischemic - management per Cardiology - EF 20-25% by echo 11/06/14 -Clinically the patient still appears volume overloaded - weight has increased from 126 kg to 131 over the course of his hospital stay - continue diuresis and follow, but may be limited by relative hypotension - weight down to 130.8 kg today - Cardiology considering life vest prior to discharge  Acute hypoxic respiratory failure - resolved - Respirations appear comfortable and nonlabored today  Acute renal failure - Cr peaked at 3.69 but subsequently normalized - following trend with creatinine increased slightly with ongoing diuresis - stop ARB for now   Hypokalemia - Resolved with supplementation  Hypocalcemia - Calcium currently corrects to normal range - further supplementation not presently indicated  Hyponatremia - follow with ongoing diuresis   HLD - Cardiology has resumed statin - following LFTs  Diabetes Mellitus 2 - 8/24 A1c 6.4 on oral medications alone - CBGs improved but remain poorly controlled likely due to infection - follow trend without change again today  Thrombocytopenia - HIT panel unrevealing - likely due to gram negative rod bacteremia  - PLT count has essentially normalized   Morbid obesity - Body mass index  is 42.17 kg/(m^2).  Code Status: FULL Family Communication: Spoke with patient and multiple family members at bedside  Antibiotics: Zosyn 9/2 >  DVT prophylaxis: SQ heparin   Objective: Blood pressure 100/51, pulse 97, temperature 98.3 F (36.8 C), temperature  source Oral, resp. rate 16, height 5\' 9"  (1.753 m), weight 130.8 kg (288 lb 5.8 oz), SpO2 92 %.  Intake/Output Summary (Last 24 hours) at 11/15/14 1057 Last data filed at 11/15/14 0700  Gross per 24 hour  Intake    580 ml  Output   1575 ml  Net   -995 ml   Exam: General: No acute respiratory distress - alert and oriented Lungs: Good air movement throughout all fields without wheeze Cardiovascular: Regular rate and rhythm without murmur or gallop Abdomen: Nontender, protuberant/obese, soft, bowel sounds positive, no rebound, no ascites, no appreciable mass Extremities: No significant cyanosis, clubbing;  1+ edema bilateral lower extremities  Data Reviewed: Basic Metabolic Panel:  Recent Labs Lab 11/09/14 0234  11/11/14 0257 11/12/14 0255 11/13/14 0237 11/14/14 0230 11/15/14 0353  NA 135  < > 132* 128* 128* 129* 128*  K 4.9  < > 5.0 4.6 4.9 4.3 4.3  CL 103  < > 97* 95* 95* 93* 93*  CO2 27  < > 27 26 24 29 28   GLUCOSE 189*  < > 266* 298* 196* 195* 235*  BUN 24*  < > 23* 26* 25* 28* 30*  CREATININE 1.01  < > 1.08 1.12 1.08 1.20 1.34*  CALCIUM 7.8*  < > 7.8* 7.7* 7.3* 7.6* 7.4*  MG 2.6*  --   --   --   --   --   --   < > = values in this interval not displayed.  CBC:  Recent Labs Lab 11/10/14 0236 11/11/14 0257 11/12/14 0255 11/13/14 0719 11/14/14 0230  WBC 15.3* 18.8* 14.7* 14.1* 11.3*  NEUTROABS 12.2* 16.2* 12.8* 11.9* 9.4*  HGB 14.3 13.0 13.1 12.0* 11.5*  HCT 41.8 38.6* 39.4 36.1* 34.8*  MCV 92.3 93.0 92.7 92.1 92.3  PLT 150 201 165 203 209    Liver Function Tests:  Recent Labs Lab 11/10/14 0236 11/12/14 0255 11/13/14 0237 11/14/14 0230 11/15/14 0353  AST 39 34 36 41 41  ALT 38 29 24 26 26   ALKPHOS 69 66 60 58 62  BILITOT 1.6* 1.3* 1.7* 1.4* 0.9  PROT 5.8* 6.8 5.2* 5.5* 5.6*  ALBUMIN 2.0* 1.7* 1.6* 1.6* 1.5*    Recent Labs Lab 11/09/14 0234 11/11/14 1119 11/14/14 0230  LIPASE 27 35 49  AMYLASE  --  59  --    CBG:  Recent Labs Lab  11/13/14 2146 11/14/14 0814 11/14/14 1144 11/14/14 1625 11/14/14 2110  GLUCAP 135* 228* 238* 207* 190*    Recent Results (from the past 240 hour(s))  Culture, blood (routine x 2)     Status: None (Preliminary result)   Collection Time: 11/12/14  4:00 PM  Result Value Ref Range Status   Specimen Description BLOOD RIGHT HAND  Final   Special Requests IN PEDIATRIC BOTTLE 2CC  Final   Culture NO GROWTH 2 DAYS  Final   Report Status PENDING  Incomplete  Culture, blood (routine x 2)     Status: None   Collection Time: 11/12/14  4:10 PM  Result Value Ref Range Status   Specimen Description BLOOD RIGHT ARM  Final   Special Requests IN PEDIATRIC BOTTLE [facility]  Final   Culture  Setup Time   Final  GRAM NEGATIVE RODS AEROBIC BOTTLE ONLY CRITICAL RESULT CALLED TO, READ BACK BY AND VERIFIED WITH: Gypsy Decant AT 0720 ON [id number] BY S. YARBROUGH    Culture ESCHERICHIA COLI  Final   Report Status 11/15/2014 FINAL  Final   Organism ID, Bacteria ESCHERICHIA COLI  Final      Susceptibility   Escherichia coli - MIC*    AMPICILLIN 4 SENSITIVE Sensitive     CEFAZOLIN <=4 SENSITIVE Sensitive     CEFEPIME <=1 SENSITIVE Sensitive     CEFTAZIDIME <=1 SENSITIVE Sensitive     CEFTRIAXONE <=1 SENSITIVE Sensitive     CIPROFLOXACIN <=0.25 SENSITIVE Sensitive     GENTAMICIN <=1 SENSITIVE Sensitive     IMIPENEM <=0.25 SENSITIVE Sensitive     TRIMETH/SULFA <=20 SENSITIVE Sensitive     AMPICILLIN/SULBACTAM <=2 SENSITIVE Sensitive     PIP/TAZO <=4 SENSITIVE Sensitive     * ESCHERICHIA COLI     Studies:   Recent x-ray studies have been reviewed in detail by the Attending Physician  Scheduled Meds:  Scheduled Meds: . antiseptic oral rinse  7 mL Mouth Rinse BID  . aspirin  81 mg Oral Daily  . atorvastatin  40 mg Oral q1800  . carvedilol  6.25 mg Oral BID WC  . furosemide  40 mg Intravenous Q12H  . heparin subcutaneous  5,000 Units Subcutaneous 3 times per day  . insulin aspart  0-20 Units  Subcutaneous TID WC  . insulin aspart  10 Units Subcutaneous TID WC  . insulin glargine  40 Units Subcutaneous QHS  . irbesartan  37.5 mg Oral BID  . piperacillin-tazobactam (ZOSYN)  IV  3.375 g Intravenous 3 times per day  . polyethylene glycol  17 g Oral BID  . senna-docusate  1 tablet Oral BID  . simethicone  40 mg Oral TID  . spironolactone  12.5 mg Oral BID  . ticagrelor  90 mg Oral BID    Time spent on care of this patient: 35 mins   MCCLUNG,JEFFREY T , MD   [facility] Office  [phone] Pager - Text Page per Loretha Stapler as per below:  On-Call/Text Page:      [email or web address]      password [email or web address]  If 7PM-7AM, please contact night-coverage [email or web address] Password [email or web address] 11/15/2014, 10:57 AM   LOS: 12 days

## 2014-11-15 NOTE — Progress Notes (Signed)
Subjective:  Stronger today; denies dyspnea or chest pain.  Objective:   Vital Signs : Filed Vitals:   11/14/14 2327 11/15/14 0410 11/15/14 0456 11/15/14 0830  BP: 93/45 100/58  100/51  Pulse: 95 94  97  Temp: 98.4 F (36.9 C) 98.6 F (37 C)  98.3 F (36.8 C)  TempSrc: Oral Oral  Oral  Resp: 16 16    Height:      Weight:   288 lb 5.8 oz (130.8 kg)   SpO2: 93% 90%  92%    Intake/Output from previous day:  Intake/Output Summary (Last 24 hours) at 11/15/14 0901 Last data filed at 11/15/14 0700  Gross per 24 hour  Intake    940 ml  Output   1577 ml  Net   -637 ml    I/O since admission: -5262  Wt Readings from Last 3 Encounters:  11/15/14 288 lb 5.8 oz (130.8 kg)  11/18/13 265 lb (120.203 kg)  11/03/13 265 lb (120.203 kg)    Medications: . antiseptic oral rinse  7 mL Mouth Rinse BID  . aspirin  81 mg Oral Daily  . atorvastatin  40 mg Oral q1800  . carvedilol  12.5 mg Oral BID WC  . furosemide  40 mg Intravenous Q12H  . heparin subcutaneous  5,000 Units Subcutaneous 3 times per day  . insulin aspart  0-20 Units Subcutaneous TID WC  . insulin aspart  10 Units Subcutaneous TID WC  . insulin glargine  40 Units Subcutaneous QHS  . irbesartan  37.5 mg Oral BID  . isosorbide mononitrate  15 mg Oral Daily  . piperacillin-tazobactam (ZOSYN)  IV  3.375 g Intravenous 3 times per day  . polyethylene glycol  17 g Oral BID  . senna-docusate  1 tablet Oral BID  . simethicone  40 mg Oral TID  . spironolactone  12.5 mg Oral BID  . ticagrelor  90 mg Oral BID    . sodium chloride Stopped (11/06/14 0700)    Physical Exam:   General appearance: alert, cooperative and no distress HEENT: normal Neck:  supple Lungs: Diminished BS bases Heart: RRR  Abdomen: mildly distended, not tender, no masses Extremities: 1+ edema Neurologic: Grossly normal   Lab Results:   Recent Labs  11/13/14 0237 11/14/14 0230 11/15/14 0353  NA 128* 129* 128*  K 4.9 4.3 4.3  CL 95*  93* 93*  CO2 _0 GLUCOSE 196* 195* 235*  BUN 25* 28* 30*  CREATININE 1.08 1.20 1.34*  CALCIUM 7.3* 7.6* 7.4*    Hepatic Function Latest Ref Rng 11/15/2014 11/14/2014 11/13/2014  Total Protein 6.5 - 8.1 g/dL 5.6(L) 5.5(L) 5.2(L)  Albumin 3.5 - 5.0 g/dL 1.5(L) 1.6(L) 1.6(L)  AST 15 - 41 U/L 41 41 36  ALT 17 - 63 U/L _1 Alk Phosphatase 38 - 126 U/L 62 58 60  Total Bilirubin 0.3 - 1.2 mg/dL 0.9 1.4(H) 1.7(H)  Bilirubin, Direct 0.1 - 0.5 mg/dL - - -     Recent Labs  11/13/14 0719 11/14/14 0230  WBC 14.1* 11.3*  NEUTROABS 11.9* 9.4*  HGB 12.0* 11.5*  HCT 36.1* 34.8*  MCV 92.1 92.3  PLT 203 209      Lab Results  Component Value Date   TSH 1.122 11/13/2009    Recent Labs  11/13/14 0237 11/14/14 0230 11/15/14 0353  PROT 5.2* 5.5* 5.6*  ALBUMIN 1.6* 1.6* 1.5*  AST 36 41 41  ALT _2 ALKPHOS 60 58  62  BILITOT 1.7* 1.4* 0.9   BNP (last 3 results)  Recent Labs  11/10/14 0236 11/13/14 0719  BNP 328.1* 251.0*    Lipid Panel     Component Value Date/Time   CHOL 139 11/04/2014 0510   TRIG 99 11/04/2014 0510   HDL 81 11/04/2014 0510   CHOLHDL 1.7 11/04/2014 0510   VLDL 20 11/04/2014 0510   LDLCALC 38 11/04/2014 0510         Imaging:  Us Abdomen Limited Ruq  11/14/2014   CLINICAL DATA:  Acute cholecystitis  EXAM: US ABDOMEN LIMITED - RIGHT UPPER QUADRANT  COMPARISON:  None.  Abdominal ultrasound - 11/11/2014; CT abdomen and pelvis - 10/26/2014  FINDINGS: Gallbladder:  There are multiple echogenic stones again seen within the gallbladder (representative image 25). This finding is associated with a minimal amount of gallbladder wall thickening and pericholecystic fluid (representative image 24). Negative sonographic Murphy's sign.  Common bile duct:  Diameter: Borderline enlarged in size for age, measuring 7 mm in diameter  Liver:  There is diffuse increased slightly coarsened echogenicity of the hepatic parenchyma suggestive of hepatic steatosis.  Re- demonstrated an ill-defined area of focal decreased echogenicity adjacent to the gallbladder fossa suggestive of focal fatty sparing. Otherwise, no discrete hepatic lesions. No definite intrahepatic biliary duct dilatation. No ascites.  IMPRESSION: 1. Similar findings of cholelithiasis and suspected early acute cholecystitis. Further evaluation with nuclear medicine HIDA scan could be performed as clinically indicated. 2. Grossly unchanged suspected hepatic steatosis with focal fatty sparing adjacent to the gallbladder fossa.   Electronically Signed   By: John  Watts M.D.   On: 11/14/2014 08:23      Assessment/Plan:  1. S/p STEMI with stent thrombosis of prior LAD stents; rx with POBA but per Dr. Harding's cath note, "He will likely require PCI with a new stent in the intervening segment between the ostial and mid LAD stents. However this can be performed once he has recovered from his pancreatitis."Plan to continue aspirin, brilinta, and statin. Ultimately will repeat catheterization once medical issues are more stable. 2. Post Infarct CHF, acute on chronic combined- continue present dose of Lasix and follow renal function. Continue ARB and beta blocker. Note BP is borderline and he was hypotensive yesterday; decrease coreg to 6.25 BID and DC imdur; follow BP and adjust regimen as needed. Some of volume excess also likely related to low oncotic state/low albumin. Encourage nutrition. 3. Pancreatitis: lipase has normalized 4. Renal insufficiency- improved. Follow renal function with continued Lasix. 5. Bacteremia-continue a antibiotics primary care. 5. Hyperlipidemia-continue statin. 6. Type 2 DM  Brian Crenshaw, MD, FACC 11/15/2014, 9:01 AM 

## 2014-11-16 ENCOUNTER — Inpatient Hospital Stay (HOSPITAL_COMMUNITY): Payer: BLUE CROSS/BLUE SHIELD

## 2014-11-16 LAB — CBC WITH DIFFERENTIAL/PLATELET
BASOS PCT: 0 % (ref 0–1)
Basophils Absolute: 0 10*3/uL (ref 0.0–0.1)
EOS PCT: 0 % (ref 0–5)
Eosinophils Absolute: 0 10*3/uL (ref 0.0–0.7)
HEMATOCRIT: 32 % — AB (ref 39.0–52.0)
HEMOGLOBIN: 10.3 g/dL — AB (ref 13.0–17.0)
Lymphocytes Relative: 4 % — ABNORMAL LOW (ref 12–46)
Lymphs Abs: 0.7 10*3/uL (ref 0.7–4.0)
MCH: 30.2 pg (ref 26.0–34.0)
MCHC: 32.2 g/dL (ref 30.0–36.0)
MCV: 93.8 fL (ref 78.0–100.0)
MONOS PCT: 2 % — AB (ref 3–12)
Monocytes Absolute: 0.3 10*3/uL (ref 0.1–1.0)
NEUTROS PCT: 94 % — AB (ref 43–77)
Neutro Abs: 16.1 10*3/uL — ABNORMAL HIGH (ref 1.7–7.7)
Platelets: 386 10*3/uL (ref 150–400)
RBC: 3.41 MIL/uL — AB (ref 4.22–5.81)
RDW: 14 % (ref 11.5–15.5)
WBC Morphology: INCREASED
WBC: 17.1 10*3/uL — AB (ref 4.0–10.5)

## 2014-11-16 LAB — COMPREHENSIVE METABOLIC PANEL
ALT: 23 U/L (ref 17–63)
AST: 49 U/L — AB (ref 15–41)
Albumin: 1.4 g/dL — ABNORMAL LOW (ref 3.5–5.0)
Alkaline Phosphatase: 85 U/L (ref 38–126)
Anion gap: 13 (ref 5–15)
BUN: 28 mg/dL — AB (ref 6–20)
CHLORIDE: 95 mmol/L — AB (ref 101–111)
CO2: 21 mmol/L — AB (ref 22–32)
Calcium: 7.2 mg/dL — ABNORMAL LOW (ref 8.9–10.3)
Creatinine, Ser: 1.81 mg/dL — ABNORMAL HIGH (ref 0.61–1.24)
GFR, EST AFRICAN AMERICAN: 47 mL/min — AB (ref >60)
GFR, EST NON AFRICAN AMERICAN: 40 mL/min — AB (ref >60)
Glucose, Bld: 229 mg/dL — ABNORMAL HIGH (ref 65–99)
POTASSIUM: 5.3 mmol/L — AB (ref 3.5–5.1)
SODIUM: 129 mmol/L — AB (ref 135–145)
Total Bilirubin: 0.8 mg/dL (ref 0.3–1.2)
Total Protein: 5.2 g/dL — ABNORMAL LOW (ref 6.5–8.1)

## 2014-11-16 LAB — BASIC METABOLIC PANEL
Anion gap: 17 — ABNORMAL HIGH (ref 5–15)
BUN: 28 mg/dL — AB (ref 6–20)
CALCIUM: 7 mg/dL — AB (ref 8.9–10.3)
CHLORIDE: 93 mmol/L — AB (ref 101–111)
CO2: 31 mmol/L (ref 22–32)
CREATININE: 2.09 mg/dL — AB (ref 0.61–1.24)
GFR, EST AFRICAN AMERICAN: 39 mL/min — AB (ref >60)
GFR, EST NON AFRICAN AMERICAN: 34 mL/min — AB (ref >60)
Glucose, Bld: 174 mg/dL — ABNORMAL HIGH (ref 65–99)
Potassium: >7.5 mmol/L (ref 3.5–5.1)
SODIUM: 141 mmol/L (ref 135–145)

## 2014-11-16 LAB — TRIGLYCERIDES: TRIGLYCERIDES: 57 mg/dL (ref <150)

## 2014-11-16 LAB — LACTIC ACID, PLASMA: LACTIC ACID, VENOUS: 5.7 mmol/L — AB (ref 0.5–2.0)

## 2014-11-16 LAB — CK TOTAL AND CKMB (NOT AT ARMC)
CK, MB: 18.1 ng/mL — ABNORMAL HIGH (ref 0.5–5.0)
Relative Index: INVALID (ref 0.0–2.5)
Total CK: 32 U/L — ABNORMAL LOW (ref 49–397)

## 2014-11-16 LAB — TROPONIN I: TROPONIN I: 0.54 ng/mL — AB (ref <0.031)

## 2014-11-16 LAB — LIPASE, BLOOD: Lipase: 69 U/L — ABNORMAL HIGH (ref 22–51)

## 2014-11-16 MED ORDER — FENTANYL CITRATE (PF) 100 MCG/2ML IJ SOLN
100.0000 ug | Freq: Once | INTRAMUSCULAR | Status: AC
  Administered 2014-11-16: 100 ug via INTRAVENOUS

## 2014-11-16 MED ORDER — VANCOMYCIN HCL 10 G IV SOLR
2000.0000 mg | Freq: Once | INTRAVENOUS | Status: DC
  Filled 2014-11-16: qty 2000

## 2014-11-16 MED ORDER — FAMOTIDINE IN NACL 20-0.9 MG/50ML-% IV SOLN: 20.0000 mg | Freq: Two times a day (BID) | INTRAVENOUS | Status: DC

## 2014-11-16 MED ORDER — FENTANYL CITRATE (PF) 100 MCG/2ML IJ SOLN
50.0000 ug | Freq: Once | INTRAMUSCULAR | Status: AC
  Administered 2014-11-16: 50 ug via INTRAVENOUS

## 2014-11-16 MED ORDER — SODIUM CHLORIDE 0.9 % IV BOLUS (SEPSIS)
500.0000 mL | Freq: Once | INTRAVENOUS | Status: AC
  Administered 2014-11-16: 500 mL via INTRAVENOUS

## 2014-11-16 MED ORDER — FENTANYL CITRATE (PF) 100 MCG/2ML IJ SOLN
INTRAMUSCULAR | Status: AC
  Filled 2014-11-16: qty 2

## 2014-11-16 MED ORDER — CHLORHEXIDINE GLUCONATE 0.12% ORAL RINSE (MEDLINE KIT): 15.0000 mL | Freq: Two times a day (BID) | OROMUCOSAL | Status: DC

## 2014-11-16 MED ORDER — VANCOMYCIN HCL IN DEXTROSE 1-5 GM/200ML-% IV SOLN: 1000.0000 mg | Freq: Two times a day (BID) | INTRAVENOUS | Status: DC

## 2014-11-16 MED ORDER — MEROPENEM 1 G IV SOLR
1.0000 g | Freq: Three times a day (TID) | INTRAVENOUS | Status: DC
  Filled 2014-11-16 (×2): qty 1

## 2014-11-16 MED ORDER — ANTISEPTIC ORAL RINSE SOLUTION (CORINZ): 7.0000 mL | Freq: Four times a day (QID) | OROMUCOSAL | Status: DC

## 2014-11-16 MED ORDER — SODIUM CHLORIDE 0.9 % IV BOLUS (SEPSIS): 1000.0000 mL | Freq: Once | INTRAVENOUS | Status: DC

## 2014-11-16 MED ORDER — PROPOFOL 1000 MG/100ML IV EMUL
5.0000 ug/kg/min | INTRAVENOUS | Status: DC
  Administered 2014-11-16: 10 ug/kg/min via INTRAVENOUS

## 2014-11-16 MED FILL — Medication: Qty: 1 | Status: AC

## 2014-11-17 LAB — CULTURE, BLOOD (ROUTINE X 2): CULTURE: NO GROWTH

## 2014-11-17 MED FILL — Medication: Qty: 1 | Status: AC

## 2014-12-12 NOTE — Procedures (Signed)
Central Venous Catheter Insertion Procedure Note Kashis Penley [id number] 1960-11-03  Procedure: Insertion of Central Venous Catheter Indications: Assessment of intravascular volume, Drug and/or fluid administration and Frequent blood sampling  Procedure Details Consent: Unable to obtain consent because of emergent medical necessity. Time Out: Verified patient identification, verified procedure, site/side was marked, verified correct patient position, special equipment/implants available, medications/allergies/relevent history reviewed, required imaging and test results available.  Performed  Maximum sterile technique was used including antiseptics, cap, gloves, gown, hand hygiene, mask and sheet. Skin prep: Chlorhexidine; local anesthetic administered A antimicrobial bonded/coated triple lumen catheter was placed in the right subclavian vein using the Seldinger technique.  Evaluation Blood flow good Complications: No apparent complications Patient did tolerate procedure well. Chest X-ray ordered to verify placement.  CXR: normal.  Lillieann Pavlich K. 12/02/2014, 4:47 AM

## 2014-12-12 NOTE — Progress Notes (Signed)
During report at 0010, pt's heart rate noted to be in the 130's-140's and was febrile with a temp of 103.1 and short of breath. Received order for tylenol  PO, 500cc NS bolus, CXR and stat ABG. During bolus, pt pressure dropped to SBP of 70's. Tama Gander and cardiology notified. After bolus, BP returned to baseline and pt's heart rate in the 90's-110's.  As RT came to room to draw ABG, pt noted to be agonal breathing and SOB. RT began bagging pt, code was called. Pt intubated, central line placed, RT currently placing arterial line. Pt will be transferred to ICU.

## 2014-12-12 NOTE — Progress Notes (Signed)
  Patient Name: Justin [name]   MRN: [id number]   Date of Birth/ Sex: 01/11/1961 , male      Admission Date: 11/09/2014  Attending Provider: Lonia Blood, MD  Primary Diagnosis: ST elevation myocardial infarction (STEMI) of lateral wall, initial episode of care, gallstone pancreatitis, bacteremia   Indication: Pt was in his usual state of health until this PM, when he was noted to be unresponsive and with agonal breathing. Code blue was subsequently called. At the time of arrival on scene, patient was receiving ambu bagging breathing. Patient was then intubated with 8.0 ET tube at 25 cm at the lips on first attempt with 4 mac and direct visualization of vocal cords by RT. Verified with positive easy cap and bilateral breath sounds.CXR pending. Per nurse, patient was febrile up to 102.8 at 0016. Pressures at that time were 141/79. Patient tachycardic in the 130-140s. Blood pressure dropped during 500 cc bolus of NS to SBP of 70s. Pressure returned to normotensive 120s/80s and HR in the 90-110s. Patient was given tylenol for fever. When nurse noted agonal breathing and unresponsiveness, she called code blue. All team members arrived swiftly. Patient was intubated as above. ACLS was not initiated as patient had a pulse. Shortly after, patient became responsive. Pressures remained soft and the decision was made to give 0.5 mg epinephrine, place central line, and begin Norepinephrine. Plans were to transfer to ICU.  Patient was stable for about 30 minutes until nurse walked into draw ABG and he brady'ed down and was without a pulse. ACLS protocol was started. Team arrived quickly. Patient was already intubated. CBC, BMET and CXR were unremarkable. Patient was in Vfib and shocks were delivered accordingly. A total of 4 defibrillations were given. Patient later went into PEA and pulse was never regained. Patient also received a total of 4 amps of bicarb, 7 mg of epinephrine, 2 grams magnesium, 30 mg of  amiodarone, and 2 amps of calcium. He was already on a Levophed gtt.  Time of death was called at 0405.   Technical Description:  - CPR performance duration:  40 minutes  - Was defibrillation or cardioversion used? Yes  - Was external pacer placed? No  - Was patient intubated pre/post CPR? Yes   Medications Administered: Y = Yes; Blank = No Amiodarone  Y  Atropine  N  Calcium  Y  Epinephrine  Y  Lidocaine  N  Magnesium  Y  Norepinephrine  Y  Phenylephrine  N  Sodium bicarbonate  Y  Vasopressin  N   Post CPR evaluation:  - Final Status - Was patient successfully resuscitated ? No - Time of death: 0405      Miscellaneous Information:  - Labs sent, including: CBC, BMET, lactic acid, CXR, troponin  - Primary team notified?  Yes  -Family notified? Yes     Jill Alexanders, DO PGY-2 Internal Medicine Resident Pager # [phone] 24-Nov-2014 4:25 AM

## 2014-12-12 NOTE — Progress Notes (Signed)
RT intubation note: RT responded to patients room. Patient was agonal breathing and unresponsive. Called code and began ambu bagging breathing.With MD in room and camera in. RT intubated patient with 8.0 ET tube at 25 cm at the lips on first attempt with 4 mac and direct visualization of vocal cords. Verified with positive easy cap and bilateral breath sounds.  cxr pending.

## 2014-12-12 NOTE — Progress Notes (Signed)
ANTIBIOTIC CONSULT NOTE - INITIAL  Pharmacy Consult for Vancomycin/Merrem Indication: rule out sepsis, E Coli bacteremia, intra-abdominal coverage  No Known Allergies  Patient Measurements: Height: 5\' 9"  (175.3 cm) Weight: 288 lb 5.8 oz (130.8 kg) IBW/kg (Calculated) : 70.7  Vital Signs: Temp: 102.8 F (39.3 C) (09/05 0016) Temp Source: Axillary (09/05 0016) BP: 141/79 mmHg (09/05 0016) Pulse Rate: 96 (09/05 0016)  Labs:  Recent Labs  11/13/14 0719 11/14/14 0230 11/15/14 0353 11/12/2014 0230  WBC 14.1* 11.3*  --  17.1*  HGB 12.0* 11.5*  --  10.3*  PLT 203 209  --  386  CREATININE  --  1.20 1.34*  --    Estimated Creatinine Clearance: 83.4 mL/min (by C-G formula based on Cr of 1.34).   Microbiology: Recent Results (from the past 720 hour(s))  MRSA PCR Screening     Status: None   Collection Time: 21-Nov-2014 11:17 PM  Result Value Ref Range Status   MRSA by PCR NEGATIVE NEGATIVE Final    Comment:        The GeneXpert MRSA Assay (FDA approved for NASAL specimens only), is one component of a comprehensive MRSA colonization surveillance program. It is not intended to diagnose MRSA infection nor to guide or monitor treatment for MRSA infections.   Culture, blood (routine x 2)     Status: None (Preliminary result)   Collection Time: 11/12/14  4:00 PM  Result Value Ref Range Status   Specimen Description BLOOD RIGHT HAND  Final   Special Requests IN PEDIATRIC BOTTLE 2CC  Final   Culture NO GROWTH 3 DAYS  Final   Report Status PENDING  Incomplete  Culture, blood (routine x 2)     Status: None   Collection Time: 11/12/14  4:10 PM  Result Value Ref Range Status   Specimen Description BLOOD RIGHT ARM  Final   Special Requests IN PEDIATRIC BOTTLE 2CC  Final   Culture  Setup Time   Final    GRAM NEGATIVE RODS AEROBIC BOTTLE ONLY CRITICAL RESULT CALLED TO, READ BACK BY AND VERIFIED WITH: Gypsy Decant AT 0720 ON [id number] BY S. YARBROUGH    Culture ESCHERICHIA COLI   Final   Report Status 11/15/2014 FINAL  Final   Organism ID, Bacteria ESCHERICHIA COLI  Final      Susceptibility   Escherichia coli - MIC*    AMPICILLIN 4 SENSITIVE Sensitive     CEFAZOLIN <=4 SENSITIVE Sensitive     CEFEPIME <=1 SENSITIVE Sensitive     CEFTAZIDIME <=1 SENSITIVE Sensitive     CEFTRIAXONE <=1 SENSITIVE Sensitive     CIPROFLOXACIN <=0.25 SENSITIVE Sensitive     GENTAMICIN <=1 SENSITIVE Sensitive     IMIPENEM <=0.25 SENSITIVE Sensitive     TRIMETH/SULFA <=20 SENSITIVE Sensitive     AMPICILLIN/SULBACTAM <=2 SENSITIVE Sensitive     PIP/TAZO <=4 SENSITIVE Sensitive     * ESCHERICHIA COLI    Medical History: Past Medical History  Diagnosis Date  . CHF 12/14/2009    EF 35% at the time of MI.  50% echo 6/12  . CAD S/P percutaneous coronary angioplasty     November 14, 2009 anterior MI LAD 80% ostial, 70% proximal followed by total occlusion, ramus intermediate total occlusion circumflex 80%, PDA 90% lesions. Overlapping drug-eluting stents to the LAD  . ST elevation myocardial infarction (STEMI) of anterior wall 11/13/2009  . Diabetes mellitus   . HTN (hypertension)   . Hyperlipidemia   . Diabetes mellitus   . Overweight  Assessment: 54 y/o M who went into respiratory failure requiring intubation this AM, WBC is elevated, mild bump in Scr, pt is febrile to 102.8, other labs as above, pt has E Coli bacteremia and was on Ceftriaxone, now broadening coverage, using Merrem given that patient has pancreatitis earlier in admit but to unstable to scan abdomen right now, other labs as above.   Goal of Therapy:  Vancomycin trough level 15-20 mcg/ml  Plan:  -Vancomycin 2000 mg IV x 1, then 1000 mg IV q12h -Merrem 1g IV q8h -F/U infectious work-up -Drug levels at steady state  Justin [name] 12/08/2014,3:28 AM

## 2014-12-12 NOTE — Progress Notes (Signed)
CRITICAL VALUE ALERT  Critical value received:  Lactic acid=5.7  Date of notification:  12/05/2014  Time of notification:  0344  Critical value read back:Yes.    Nurse who received alert:  Madilyn Fireman, RN  MD notified (1st page): spoke to  Dr. Berton Lan  Time of first page:  [phone]

## 2014-12-12 NOTE — Progress Notes (Signed)
Irregular rhythm noted on ECG monitor with a rate in the 50's while RT placing arterial line. Thought to be PEA. Pt unresponsive. Blood pressure obtained and was 62/41. No palpable or dopplerable pulse. Code blue called. Family called from waiting room to watch.

## 2014-12-12 NOTE — Discharge Summary (Signed)
Death Summary  Justin [name] RUE:[id number] DOB: 1961-02-09 DOA: Nov 22, 2014  PCP: [name], MD  Admit date: 11-22-14 Date of Death: 12/05/2014  Final Diagnoses:    ST elevation myocardial infarction (STEMI) of lateral wall, initial episode of care   Hyperlipidemia with target LDL less than 70   CAD S/P percutaneous coronary angioplasty: DES x 2 to mid-dLAD, x 1 to ostial-prox LAD   Chronic combined systolic and diastolic congestive heart failure   Acute pancreatitis   Coronary stent thrombosis   Essential hypertension   Type 2 diabetes mellitus without complication   Hypoxia   Abdominal distension   Diabetes type 2, controlled   Abdominal pain   Cholelithiasis with cholecystitis of other acuity   Acute renal failure syndrome   Thrombocytopenia   Systolic and diastolic CHF, chronic   HLD (hyperlipidemia)   Gallstone pancreatitis   Ileus  History of present illness:  71M with DM, HTN, HLD, obesity and CAD s/p anterior wall MI (2011) with residual 3VD that was not amendeble to revascularization c/b systolic CHF (EF nadir 30-35%, improved to 45-50% in 2012) who presented 11/22/2014 with a lateral STEMI in the setting of acute pancreatitis. Admitted under Cardiology team. [facility] was initially consulted by Cardiology team for evaluation of abd pain and pancreatitis.  Hospital Course:  Briefly, the patient was originally admitted by the cardiology service for treatment of an ST elevation myocardial infarction.  His course was complicated by concomitant severe acute gallstone pancreatitis with transaminitis, Escherichia coli bacteremia, as well as acute renal failure.  These complicating medical conditions prevented the cardiology team from being able to completely intervene on a thrombosed LAD stent.  The plan was to allow the patient to stabilize for a medical standpoint and then return to the Cath Lab for PCI and new stent placement in the implicated region of the LAD.  Though  the patient's hospital course was quite prolonged and his progress was slow, he was felt to be consistently and steadily improving.  In the early morning hours of 12-05-2014 the patient developed new symptoms of shortness of breath and fever to 102.8.  These complaints were first noted at 12:15 AM.  At 2:34 AM respiratory therapy presented to the patient's room and found him to be breathing in a diagonal fashion and unresponsive.  A CODE BLUE was called with the code team and critical care directing the resuscitation efforts.  After intubation the patient reportedly initially stabilized.  He remained stable for an approximated 30 minute time frame, after which he was then noted to develop severe bradycardia with decayed into asystole.  ACLS was resumed and patient transitioned into ventricular fibrillation.  Despite aggressive resuscitation efforts the patient was not able to be revived the second time.  He was pronounced dead at 04:05 on 12/05/14.  The exact cause of death is not clear, but the clinical scenario is certainly concerning for the possibility of a large myocardial infarction.   The text below represents the status of the patient's medical condition on the day preceding his precipitous and unexpected decline:  CAD - STEMI of lateral wall / stent thrombosis of prior LAD stents - as per Cardiology - plan for eventual PCI with new stents to be placed in the LAD - as per my discussion with ID there is no need to delay interventional cardiac cath/stent placement based solely upon Escherichia coli bacteremia  Acute gallstone pancreatitis - transaminitis  - lipase on admission > 3000 - has since normalized -  LFTs were elevated with [place] notable for cholelithiasis and gallbladder wall thickening - pt likely passed stone - will need referral for outpatient follow up - to schedule with Dr. [name] once acute medical issues resolved - pt will eventually need lap chole  - LFTs have now normalized  -  No clinical evidence of acute cholecystitis at the present time - follow-up right upper quadrant ultrasound without new findings - Patient appears to be medically stable presently in regard to this acute issue  E coli bacteremia  - The patient is afebrile and has no localizing clinical symptoms - UA unremarkable, suggesting GI issues above as source of bacteremia  - Repeat right upper quadrant abdominal ultrasound to reevaluate the gallbladder without new findings - plan to complete a 14 day course of therapy - I discussed the clinical situation with ID who states there is no need to delay cardiac cath based upon this finding  Ileus - pulled out NGT 8/26 - tolerating current diet w/o difficulty  - continue bowel regimen - having regular bowel movements  Acute on chronic combined systolic and diastolic CHF - ischemic - management per Cardiology - EF 20-25% by echo 11/06/14 -Clinically the patient still appears volume overloaded - weight has increased from 126 kg to 131 over the course of his hospital stay - continue diuresis and follow, but may be limited by relative hypotension - weight down to 130.8 kg today - Cardiology considering life vest prior to discharge  Acute hypoxic respiratory failure - resolved - Respirations appear comfortable and nonlabored today  Acute renal failure - Cr peaked at 3.69 but subsequently normalized - following trend with creatinine increased slightly with ongoing diuresis - stop ARB for now   Hypokalemia - Resolved with supplementation  Hypocalcemia - Calcium currently corrects to normal range - further supplementation not presently indicated  Hyponatremia - follow with ongoing diuresis   HLD - Cardiology has resumed statin - following LFTs  Diabetes Mellitus 2 - 8/24 A1c 6.4 on oral medications alone - CBGs improved but remain poorly controlled likely due to infection - follow trend without change again today  Thrombocytopenia - HIT panel  unrevealing - likely due to gram negative rod bacteremia  - PLT count has essentially normalized   Morbid obesity - Body mass index is 42.17 kg/(m^2).   SignedJetty Duhamel T  [facility] 12/04/2014, 9:33 AM

## 2014-12-12 NOTE — Progress Notes (Signed)
Pt c/o sob, freezing.  Gave pts warm blankets.  Pt sob sats.  86% placed on vm 40% 8L.Hr 130's   Lungs clear little dim to bases.  Temp 102.8 A. Removed  Blankets. md paged.  New orders received. Will continue to monitor. [name]

## 2014-12-12 NOTE — Progress Notes (Addendum)
Pt time of death 0405 called by Dr. Sandi Carne. Girlfriend Naser Schuld present during code.

## 2014-12-12 DEATH — deceased

## 2016-01-26 IMAGING — CR DG CHEST 1V PORT
1 series · 1 of 1 positions shown · non-contrast
Comparison: 11/16/2014

CLINICAL DATA: Central line placement. Shortness of breath and
fever.

EXAM:
PORTABLE CHEST - 1 VIEW

[AP]
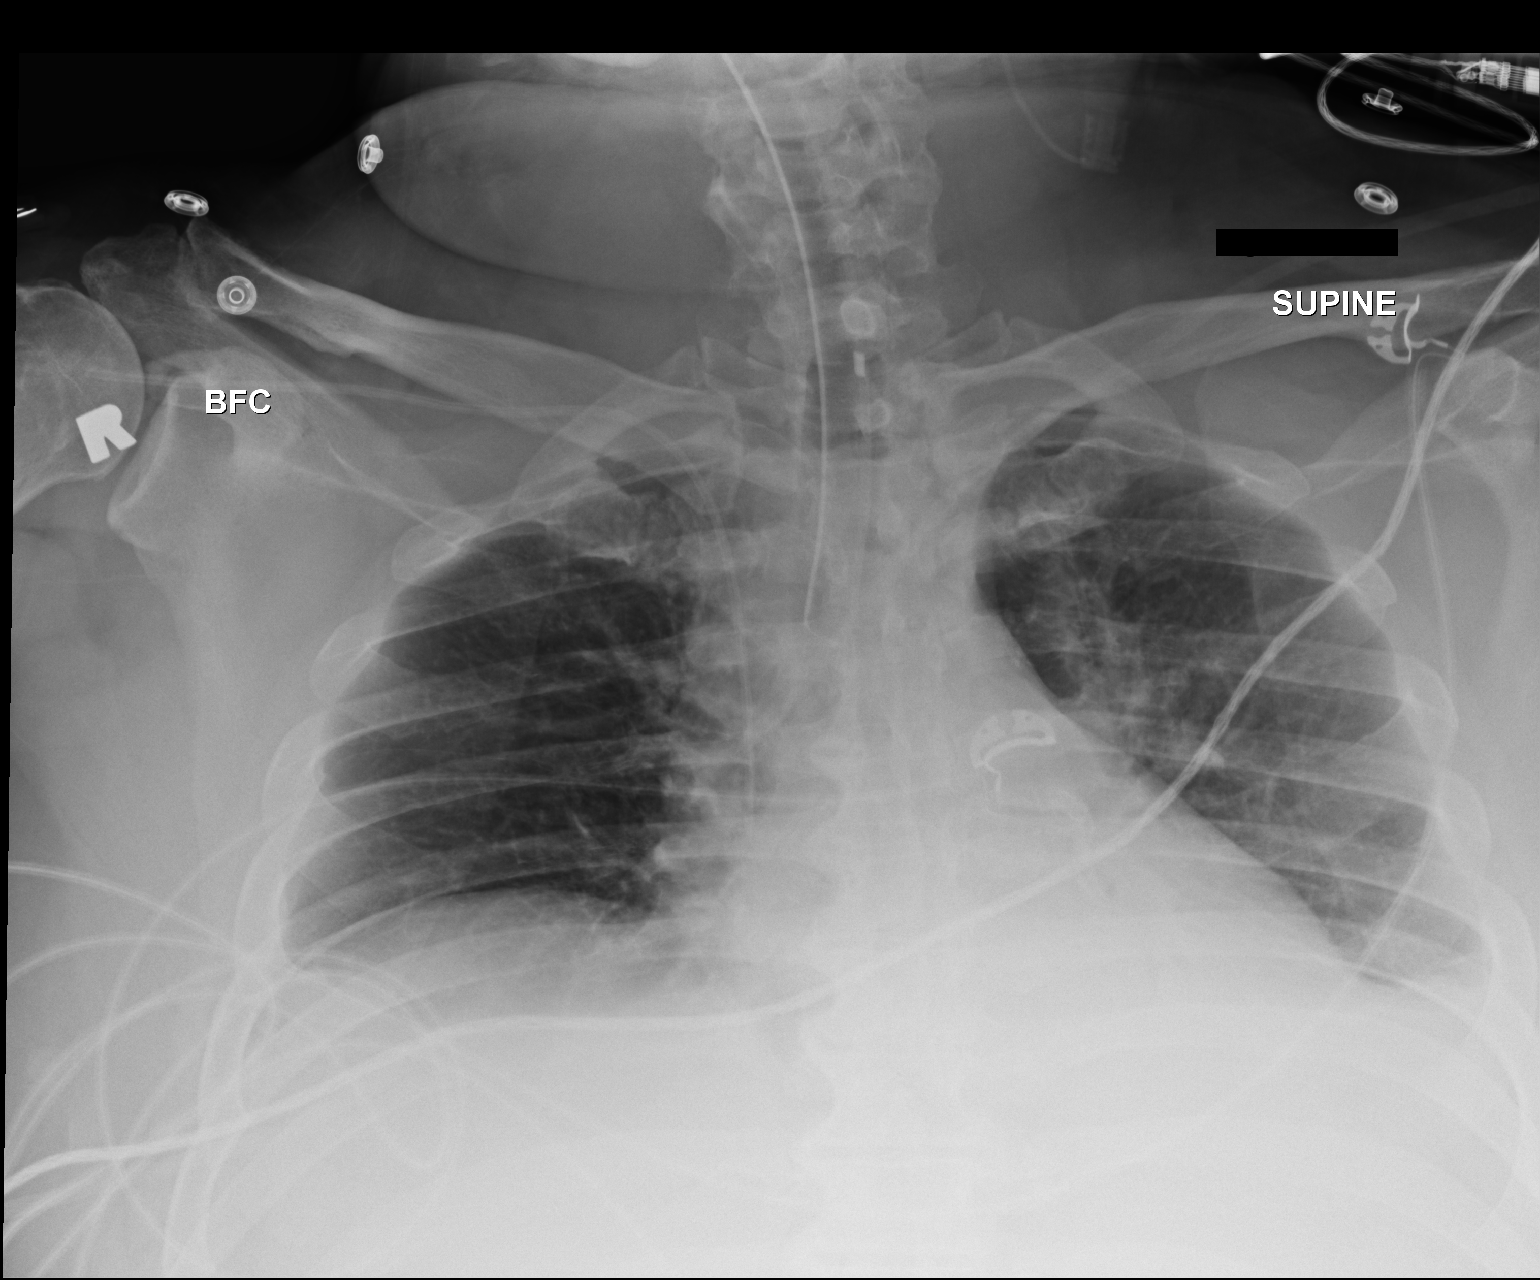

[1 of 1 positions shown; findings below may reference images not displayed]

FINDINGS: Endotracheal tube placed with tip measuring 2.7 cm above the carina.
Right central venous catheter placed with tip over the cavoatrial
junction. No pneumothorax. Shallow inspiration with infiltration or
atelectasis in the left lung base. Heart size and pulmonary
vascularity are normal.
IMPRESSION: Appliances appear in satisfactory location. Shallow inspiration.
Atelectasis or infiltration in the left lung base.
